# Patient Record
Sex: Female | Born: 1960 | Race: White | Hispanic: No | Marital: Single | Smoking: Current some day smoker
Health system: Southern US, Community
[De-identification: ages and names within clinical notes are randomized; demographics above are authoritative.]

## PROBLEM LIST (undated history)

## (undated) DIAGNOSIS — Z9109 Other allergy status, other than to drugs and biological substances: Secondary | ICD-10-CM

## (undated) DIAGNOSIS — F329 Major depressive disorder, single episode, unspecified: Secondary | ICD-10-CM

## (undated) DIAGNOSIS — K219 Gastro-esophageal reflux disease without esophagitis: Secondary | ICD-10-CM

## (undated) DIAGNOSIS — R519 Headache, unspecified: Secondary | ICD-10-CM

## (undated) DIAGNOSIS — T7840XA Allergy, unspecified, initial encounter: Secondary | ICD-10-CM

## (undated) DIAGNOSIS — M1990 Unspecified osteoarthritis, unspecified site: Secondary | ICD-10-CM

## (undated) DIAGNOSIS — R51 Headache: Secondary | ICD-10-CM

## (undated) DIAGNOSIS — F32A Depression, unspecified: Secondary | ICD-10-CM

## (undated) HISTORY — DX: Major depressive disorder, single episode, unspecified: F32.9

## (undated) HISTORY — DX: Gastro-esophageal reflux disease without esophagitis: K21.9

## (undated) HISTORY — DX: Allergy, unspecified, initial encounter: T78.40XA

## (undated) HISTORY — PX: GASTRIC BYPASS: SHX52

## (undated) HISTORY — PX: GALLBLADDER SURGERY: SHX652

## (undated) HISTORY — DX: Depression, unspecified: F32.A

---

## 2005-08-01 ENCOUNTER — Ambulatory Visit: Payer: Self-pay | Admitting: Family Medicine

## 2005-09-19 HISTORY — PX: COLONOSCOPY: SHX174

## 2007-11-26 ENCOUNTER — Ambulatory Visit: Payer: Self-pay | Admitting: Gastroenterology

## 2008-07-18 ENCOUNTER — Ambulatory Visit: Payer: Self-pay | Admitting: Family Medicine

## 2008-08-05 ENCOUNTER — Ambulatory Visit: Payer: Self-pay | Admitting: Family Medicine

## 2010-04-26 IMAGING — CR DG CHEST 2V
1 series · 2 of 2 positions shown · non-contrast
Comparison: none

REASON FOR EXAM: cough, pneumonia
COMMENTS:

[Series 1: view not recorded · 0.17mm/px · 2 of 2 slices shown]
[im 1/2]
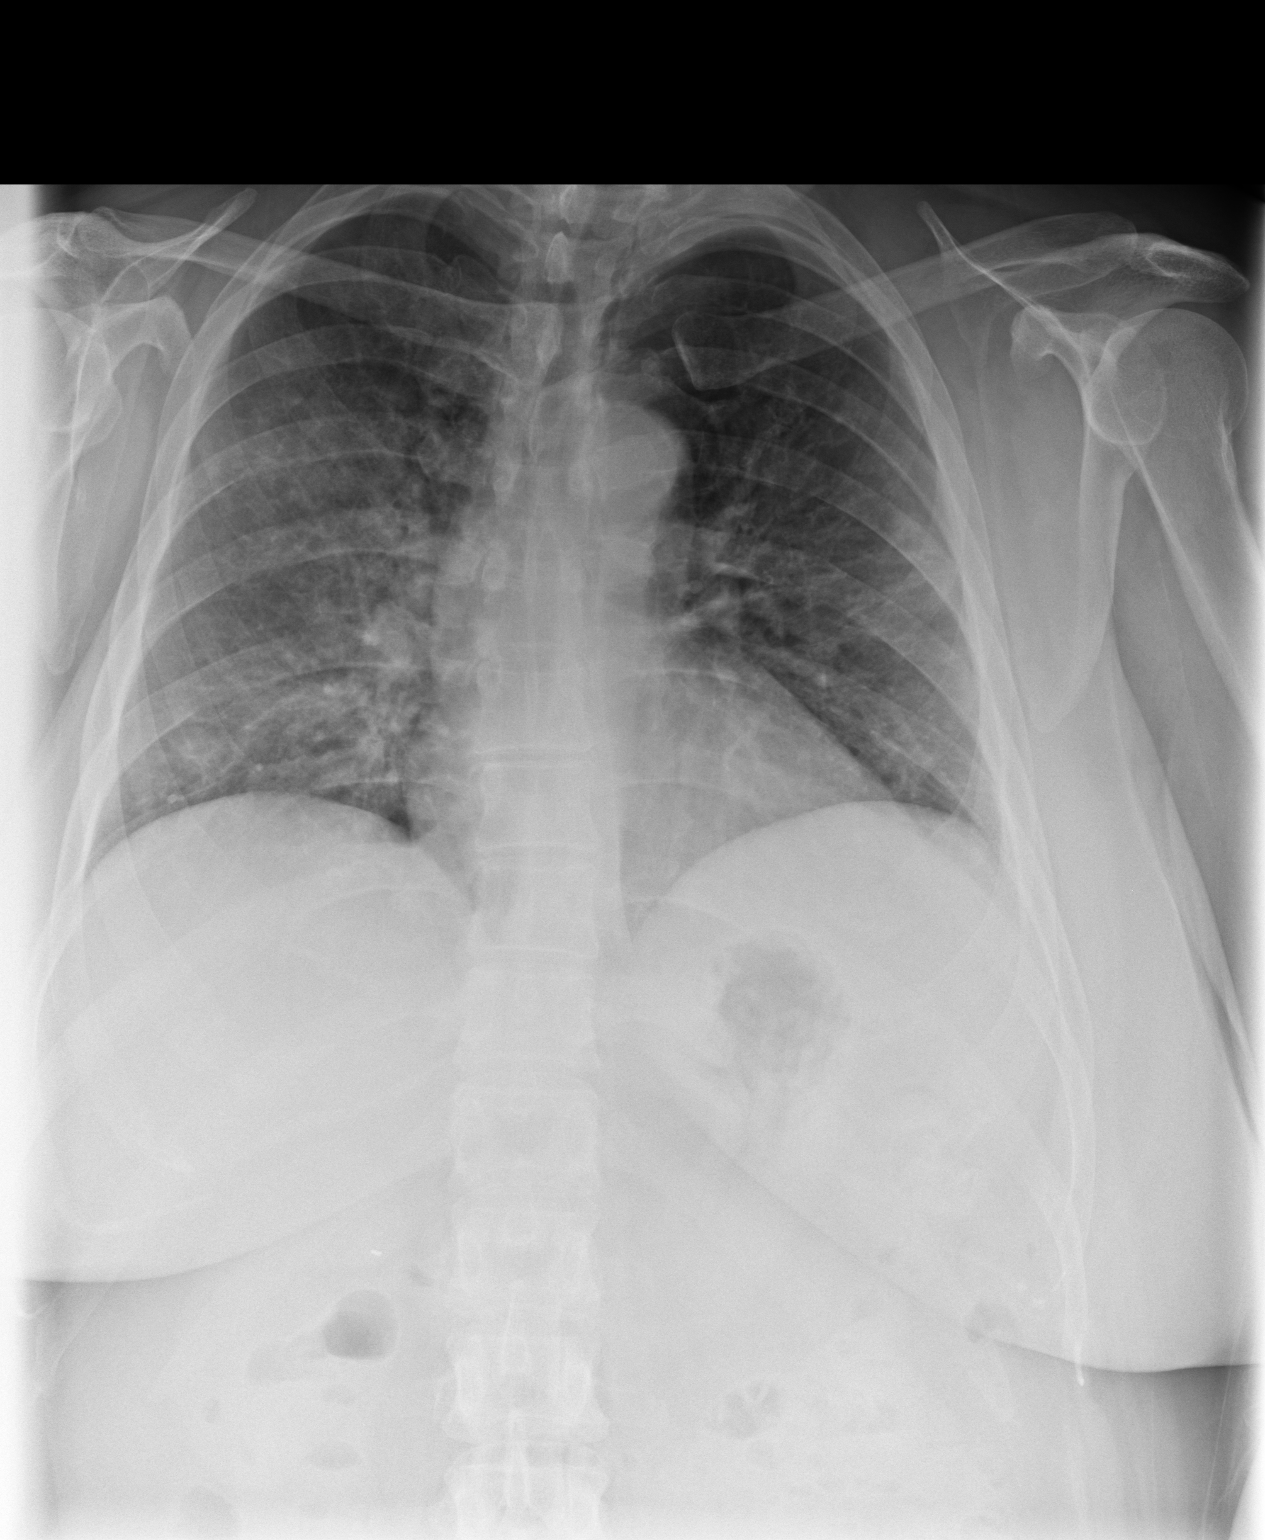
[im 2/2]
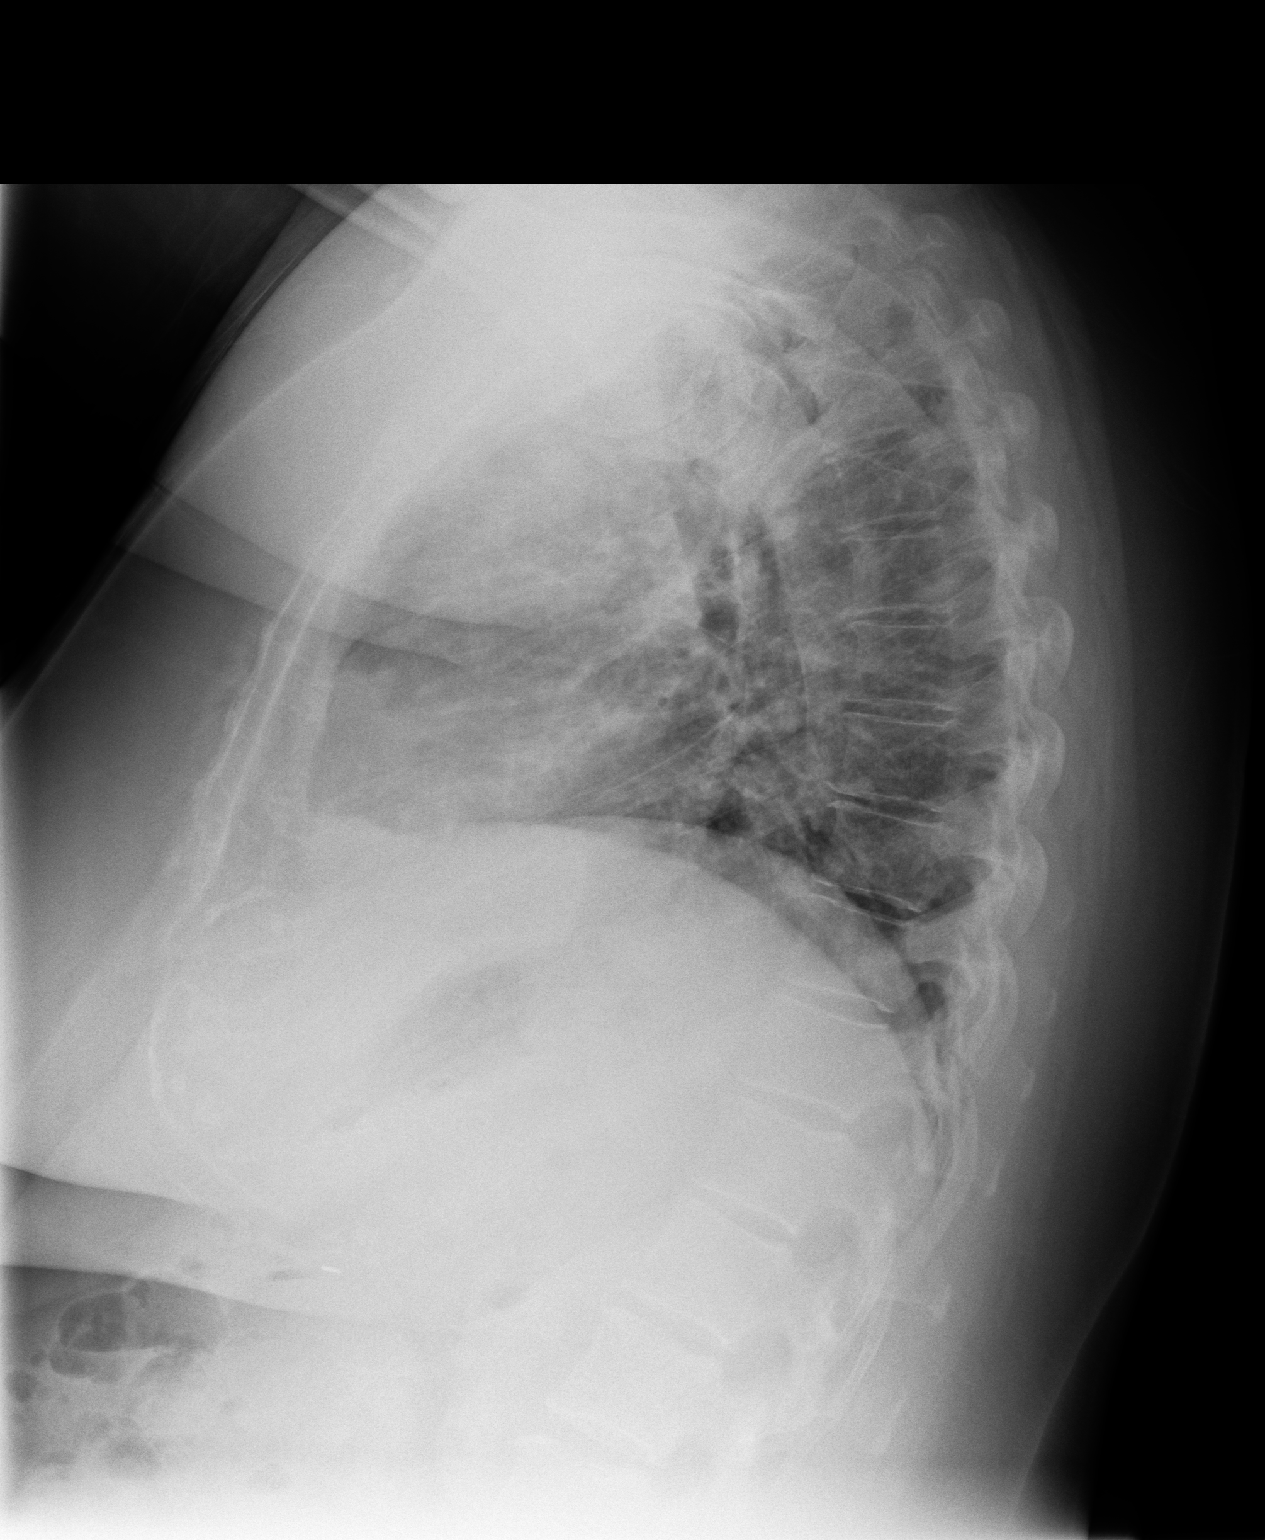

[2 of 2 positions shown; findings below may reference images not displayed]

PROCEDURE:     MDR - MDR CHEST PA(OR AP) AND LATERAL  - July 18, 2008 [DATE]

RESULT:     There is a hazy increase in density in the right midlung field
and a minimal focal area of increased density in the left midlung field.
These findings could be chronic or secondary to an early infiltrate.
Followup is recommended. No pleural effusion is seen. Heart size is normal.
IMPRESSION: 1. There is increased density observed bilaterally, more prominent on the
right and suspicious for pneumonia. Followup examination is recommended.

## 2012-09-19 HISTORY — PX: NASAL SINUS SURGERY: SHX719

## 2013-09-16 ENCOUNTER — Ambulatory Visit: Payer: Self-pay | Admitting: Otolaryngology

## 2014-02-20 ENCOUNTER — Ambulatory Visit: Payer: Self-pay | Admitting: Family Medicine

## 2015-01-06 ENCOUNTER — Ambulatory Visit
Admit: 2015-01-06 | Disposition: A | Discharge status: Home / Self Care | Payer: Self-pay | Attending: Family Medicine | Admitting: Family Medicine

## 2015-03-07 ENCOUNTER — Other Ambulatory Visit: Payer: Self-pay | Admitting: Family Medicine

## 2015-03-07 DIAGNOSIS — J301 Allergic rhinitis due to pollen: Secondary | ICD-10-CM

## 2015-07-06 ENCOUNTER — Other Ambulatory Visit: Payer: Self-pay | Admitting: Family Medicine

## 2015-07-27 ENCOUNTER — Other Ambulatory Visit: Payer: Self-pay | Admitting: Family Medicine

## 2015-07-27 DIAGNOSIS — K219 Gastro-esophageal reflux disease without esophagitis: Secondary | ICD-10-CM

## 2015-08-17 ENCOUNTER — Other Ambulatory Visit: Payer: Self-pay | Admitting: Family Medicine

## 2015-08-28 ENCOUNTER — Ambulatory Visit: Payer: Self-pay | Admitting: Family Medicine

## 2015-09-17 ENCOUNTER — Other Ambulatory Visit: Payer: Self-pay

## 2015-09-18 ENCOUNTER — Ambulatory Visit (INDEPENDENT_AMBULATORY_CARE_PROVIDER_SITE_OTHER): Payer: BC Managed Care – PPO | Admitting: Family Medicine

## 2015-09-18 ENCOUNTER — Encounter: Payer: Self-pay | Admitting: Family Medicine

## 2015-09-18 VITALS — BP 120/80 | HR 70 | Ht 63.0 in | Wt 183.0 lb

## 2015-09-18 DIAGNOSIS — F3342 Major depressive disorder, recurrent, in full remission: Secondary | ICD-10-CM | POA: Diagnosis not present

## 2015-09-18 DIAGNOSIS — K219 Gastro-esophageal reflux disease without esophagitis: Secondary | ICD-10-CM

## 2015-09-18 DIAGNOSIS — J301 Allergic rhinitis due to pollen: Secondary | ICD-10-CM | POA: Diagnosis not present

## 2015-09-18 MED ORDER — FLUTICASONE PROPIONATE 50 MCG/ACT NA SUSP: NASAL | Status: DC

## 2015-09-18 MED ORDER — ESOMEPRAZOLE MAGNESIUM 40 MG PO CPDR: 40.0000 mg | DELAYED_RELEASE_CAPSULE | Freq: Every day | ORAL | Status: DC

## 2015-09-18 MED ORDER — SERTRALINE HCL 100 MG PO TABS: ORAL_TABLET | ORAL | Status: DC

## 2015-09-18 NOTE — Progress Notes (Signed)
Name: Melissa Huerta   MRN: [id number]    DOB: 02/08/1961   Date:09/18/2015       Progress Note  Subjective  Chief Complaint  Chief Complaint  Patient presents with  . Gastroesophageal Reflux    double up to 40mg  on Nexium?  . Depression  . Allergic Rhinitis     Gastroesophageal Reflux She complains of heartburn and a sore throat. She reports no abdominal pain, no belching, no chest pain, no choking, no coughing, no dysphagia, no globus sensation, no hoarse voice, no nausea or no wheezing. This is a chronic problem. The current episode started more than 1 year ago. The problem occurs frequently. The problem has been gradually worsening. The heartburn duration is several minutes. The heartburn is located in the substernum. The heartburn is of moderate intensity. The heartburn wakes her from sleep. The heartburn does not limit her activity. The symptoms are aggravated by certain foods. Pertinent negatives include no melena or weight loss. There are no known risk factors. She has tried an antacid and a PPI for the symptoms. The treatment provided mild relief.  Depression      The patient presents with depression.  This is a chronic problem.  The current episode started more than 1 year ago.   The onset quality is gradual.   The problem occurs intermittently.  The problem has been waxing and waning since onset.  Associated symptoms include does not have insomnia, no myalgias, no headaches and no suicidal ideas.     The symptoms are aggravated by nothing.  Past treatments include SSRIs - Selective serotonin reuptake inhibitors.  Past medical history includes depression.     Pertinent negatives include no chronic fatigue syndrome, no chronic illness, no recent illness, no dementia, no post-traumatic stress disorder and no suicide attempts.   No problem-specific assessment & plan notes found for this encounter.   Past Medical History  Diagnosis Date  . Allergy   . Depression   . GERD  (gastroesophageal reflux disease)     Past Surgical History  Procedure Laterality Date  . Gallbladder surgery    . Gastric bypass    . Colonoscopy  2007     [facility] docs    Family History  Problem Relation Age of Onset  . Hypertension Mother   . Hypertension Sister     Social History   Social History  . Marital Status: Single    Spouse Name: N/A  . Number of Children: N/A  . Years of Education: N/A   Occupational History  . Not on file.   Social History Main Topics  . Smoking status: Current Some Day Smoker  . Smokeless tobacco: Not on file  . Alcohol Use: 0.0 oz/week    0 Standard drinks or equivalent per week  . Drug Use: No  . Sexual Activity: Not Currently   Other Topics Concern  . Not on file   Social History Narrative  . No narrative on file    Allergies  Allergen Reactions  . Codeine   . Penicillins      Review of Systems  Constitutional: Negative for fever, chills, weight loss and malaise/fatigue.  HENT: Positive for sore throat. Negative for ear discharge, ear pain and hoarse voice.   Eyes: Negative for blurred vision.  Respiratory: Negative for cough, sputum production, choking, shortness of breath and wheezing.   Cardiovascular: Negative for chest pain, palpitations and leg swelling.  Gastrointestinal: Positive for heartburn. Negative for dysphagia, nausea, abdominal pain, diarrhea,  constipation, blood in stool and melena.  Genitourinary: Negative for dysuria, urgency, frequency and hematuria.  Musculoskeletal: Negative for myalgias, back pain, joint pain and neck pain.  Skin: Negative for rash.  Neurological: Negative for dizziness, tingling, sensory change, focal weakness and headaches.  Endo/Heme/Allergies: Negative for environmental allergies and polydipsia. Does not bruise/bleed easily.  Psychiatric/Behavioral: Positive for depression. Negative for suicidal ideas. The patient is not nervous/anxious and does not have insomnia.       Objective  Filed Vitals:   09/18/15 1339  BP: 120/80  Pulse: 70  Height: 5\' 3"  (1.6 m)  Weight: 183 lb (83.008 kg)    Physical Exam  Constitutional: She is well-developed, well-nourished, and in no distress. No distress.  HENT:  Head: Normocephalic and atraumatic.  Right Ear: External ear normal.  Left Ear: External ear normal.  Nose: Nose normal.  Mouth/Throat: Oropharynx is clear and moist.  Eyes: Conjunctivae and EOM are normal. Pupils are equal, round, and reactive to light. Right eye exhibits no discharge. Left eye exhibits no discharge.  Neck: Normal range of motion. Neck supple. No JVD present. No thyromegaly present.  Cardiovascular: Normal rate, regular rhythm, normal heart sounds and intact distal pulses.  Exam reveals no gallop and no friction rub.   No murmur heard. Pulmonary/Chest: Effort normal and breath sounds normal. Right breast exhibits no inverted nipple, no mass, no nipple discharge, no skin change and no tenderness. Left breast exhibits tenderness. Left breast exhibits no inverted nipple, no mass, no nipple discharge and no skin change. Breasts are symmetrical.  Abdominal: Soft. Bowel sounds are normal. She exhibits no mass. There is no tenderness. There is no guarding.  Musculoskeletal: Normal range of motion. She exhibits no edema.  Lymphadenopathy:    She has no cervical adenopathy.  Neurological: She is alert. She has normal reflexes.  Skin: Skin is warm and dry. She is not diaphoretic.  Psychiatric: Mood and affect normal.  Nursing note and vitals reviewed.     Assessment & Plan  Problem List Items Addressed This Visit    None    Visit Diagnoses    Gastroesophageal reflux disease, esophagitis presence not specified    -  Primary    Relevant Medications    esomeprazole (NEXIUM) 40 MG capsule    Recurrent major depressive disorder, in full remission ([place])        Relevant Medications    sertraline (ZOLOFT) 100 MG tablet    Allergic  rhinitis due to pollen        Seasonal allergic rhinitis due to pollen        Relevant Medications    fluticasone (FLONASE) 50 MCG/ACT nasal spray         Dr. [facility]  09/18/2015

## 2015-10-20 ENCOUNTER — Other Ambulatory Visit: Payer: Self-pay

## 2015-10-20 MED ORDER — ESOMEPRAZOLE MAGNESIUM 20 MG PO CPDR: 20.0000 mg | DELAYED_RELEASE_CAPSULE | Freq: Every day | ORAL | Status: DC

## 2015-10-22 ENCOUNTER — Other Ambulatory Visit: Payer: Self-pay

## 2015-10-22 DIAGNOSIS — K219 Gastro-esophageal reflux disease without esophagitis: Secondary | ICD-10-CM

## 2015-10-22 MED ORDER — PANTOPRAZOLE SODIUM 40 MG PO TBEC
40.0000 mg | DELAYED_RELEASE_TABLET | Freq: Every day | ORAL | Status: DC
Start: 2015-10-22 — End: 2015-12-14

## 2015-11-11 ENCOUNTER — Ambulatory Visit (INDEPENDENT_AMBULATORY_CARE_PROVIDER_SITE_OTHER): Payer: BC Managed Care – PPO | Admitting: Family Medicine

## 2015-11-11 ENCOUNTER — Encounter: Payer: Self-pay | Admitting: Family Medicine

## 2015-11-11 VITALS — BP 130/62 | HR 74 | Ht 63.0 in | Wt 184.0 lb

## 2015-11-11 DIAGNOSIS — M1990 Unspecified osteoarthritis, unspecified site: Secondary | ICD-10-CM

## 2015-11-11 DIAGNOSIS — M19041 Primary osteoarthritis, right hand: Secondary | ICD-10-CM

## 2015-11-11 DIAGNOSIS — K219 Gastro-esophageal reflux disease without esophagitis: Secondary | ICD-10-CM

## 2015-11-11 DIAGNOSIS — M19042 Primary osteoarthritis, left hand: Principal | ICD-10-CM

## 2015-11-11 MED ORDER — ETODOLAC 400 MG PO TABS: 400.0000 mg | ORAL_TABLET | Freq: Two times a day (BID) | ORAL | Status: DC

## 2015-11-11 NOTE — Progress Notes (Signed)
Name: Melissa Huerta   MRN: [id number]    DOB: 07/14/1961   Date:11/11/2015       Progress Note  Subjective  Chief Complaint  Chief Complaint  Patient presents with  . Hand Pain    going up into her elbow in R) arm- involves mainly the pointer and middle fingers    Hand Pain  The incident occurred more than 1 week ago. There was no injury mechanism. The pain is present in the right wrist, right hand, right forearm and right fingers. The quality of the pain is described as aching. The pain is at a severity of 5/10. The pain is moderate. The pain has been fluctuating since the incident. Associated symptoms include numbness. Pertinent negatives include no chest pain, muscle weakness or tingling. She has tried acetaminophen and NSAIDs for the symptoms. The treatment provided mild relief.    No problem-specific assessment & plan notes found for this encounter.   Past Medical History  Diagnosis Date  . Allergy   . Depression   . GERD (gastroesophageal reflux disease)     Past Surgical History  Procedure Laterality Date  . Gallbladder surgery    . Gastric bypass    . Colonoscopy  2007     [facility] docs    Family History  Problem Relation Age of Onset  . Hypertension Mother   . Hypertension Sister     Social History   Social History  . Marital Status: Single    Spouse Name: N/A  . Number of Children: N/A  . Years of Education: N/A   Occupational History  . Not on file.   Social History Main Topics  . Smoking status: Current Some Day Smoker  . Smokeless tobacco: Not on file  . Alcohol Use: 0.0 oz/week    0 Standard drinks or equivalent per week  . Drug Use: No  . Sexual Activity: Not Currently   Other Topics Concern  . Not on file   Social History Narrative    Allergies  Allergen Reactions  . Codeine   . Penicillins      Review of Systems  Constitutional: Negative for fever, chills, weight loss and malaise/fatigue.  HENT: Negative for ear discharge, ear pain  and sore throat.   Eyes: Negative for blurred vision.  Respiratory: Negative for cough, sputum production, shortness of breath and wheezing.   Cardiovascular: Negative for chest pain, palpitations and leg swelling.  Gastrointestinal: Positive for heartburn. Negative for nausea, abdominal pain, diarrhea, constipation, blood in stool and melena.  Genitourinary: Negative for dysuria, urgency, frequency and hematuria.  Musculoskeletal: Negative for myalgias, back pain, joint pain and neck pain.  Skin: Negative for rash.  Neurological: Positive for numbness. Negative for dizziness, tingling, sensory change, focal weakness and headaches.  Endo/Heme/Allergies: Negative for environmental allergies and polydipsia. Does not bruise/bleed easily.  Psychiatric/Behavioral: Negative for depression and suicidal ideas. The patient is not nervous/anxious and does not have insomnia.      Objective  Filed Vitals:   11/11/15 1128  BP: 130/62  Pulse: 74  Height: 5\' 3"  (1.6 m)  Weight: 184 lb (83.462 kg)    Physical Exam  Constitutional: She is well-developed, well-nourished, and in no distress. No distress.  HENT:  Head: Normocephalic and atraumatic.  Right Ear: External ear normal.  Left Ear: External ear normal.  Nose: Nose normal.  Mouth/Throat: Oropharynx is clear and moist.  Eyes: Conjunctivae and EOM are normal. Pupils are equal, round, and reactive to light. Right eye  exhibits no discharge. Left eye exhibits no discharge.  Neck: Normal range of motion. Neck supple. No JVD present. No thyromegaly present.  Cardiovascular: Normal rate, regular rhythm, normal heart sounds and intact distal pulses.  Exam reveals no gallop and no friction rub.   No murmur heard. Pulmonary/Chest: Effort normal and breath sounds normal.  Abdominal: Soft. Bowel sounds are normal. She exhibits no mass. There is no tenderness. There is no guarding.  Musculoskeletal: Normal range of motion. She exhibits no edema.        Right hand: She exhibits tenderness and swelling. Normal sensation noted. Normal strength noted.       Left hand: She exhibits tenderness and swelling.  Lymphadenopathy:    She has no cervical adenopathy.  Neurological: She is alert.  Skin: Skin is warm and dry. She is not diaphoretic.  Psychiatric: Mood and affect normal.  Nursing note and vitals reviewed.     Assessment & Plan  Problem List Items Addressed This Visit    None    Visit Diagnoses    Arthritis of both hands    -  Primary    Relevant Medications    etodolac (LODINE) 400 MG tablet    Other Relevant Orders    Ambulatory referral to Rheumatology    Gastroesophageal reflux disease, esophagitis presence not specified        sample dexilant         Dr. [facility]  11/11/2015

## 2015-11-11 NOTE — Patient Instructions (Signed)
Rheumatoid Arthritis  Rheumatoid arthritis is a long-term (chronic) inflammatory disease that causes pain, swelling, and stiffness of the joints. It can affect the entire body, including the eyes and lungs. The effects of rheumatoid arthritis vary widely among those with the condition.  CAUSES  The cause of rheumatoid arthritis is not known. It tends to run in families and is more common in women. Certain cells of the body's natural defense system (immune system) do not work properly and begin to attack healthy joints. It primarily involves the connective tissue that lines the joints (synovial membrane). This can cause damage to the joint.  SYMPTOMS  · Pain, stiffness, swelling, and decreased motion of many joints, especially in the hands and feet.  · Stiffness that is worse in the morning. It may last 1-2 hours or longer.  · Numbness and tingling in the hands.  · Fatigue.  · Loss of appetite.  · Weight loss.  · Low-grade fever.  · Dry eyes and mouth.  · Firm lumps (rheumatoid nodules) that grow beneath the skin in areas such as the elbows and hands.  DIAGNOSIS  Diagnosis is based on the symptoms described, an exam, and blood tests. Sometimes, X-rays are helpful.  TREATMENT  The goals of treatment are to relieve pain, reduce inflammation, and to slow down or stop joint damage and disability. Methods vary and may include:  · Maintaining a balance of rest, exercise, and proper nutrition.  · Your health care provider may adjust your medicines every 3 months until treatment goals are reached. Common medicines include:    Pain relievers (analgesics).    Corticosteroids and nonsteroidal anti-inflammatory drugs (NSAIDs) to reduce inflammation.    Disease-modifying antirheumatic drugs (DMARDs) to try to slow the course of the disease.    Biologic response modifiers to reduce inflammation and damage.  · Physical therapy and occupational therapy.  · Surgery for patients with severe joint damage. Joint replacement or fusing of  joints may be needed.  · Routine monitoring and ongoing care, such as office visits, blood and urine tests, and X-rays.  Your health care provider will work with you to identify the best treatment option for you, based on an assessment of the overall disease activity in your body.  HOME CARE INSTRUCTIONS  · Remain physically active and reduce activity when the disease gets worse.  · Eat a well-balanced diet.  · Put heat on affected joints when you wake up and before activities. Keep the heat on the affected joint for as long as directed by your health care provider.  · Put ice on affected joints following activities or exercising.    Put ice in a plastic bag.    Place a towel between your skin and the bag.    Leave the ice on for 15-20 minutes, 3-4 times per day, or as directed by your health care provider.  · Take medicines and supplements only as directed by your health care provider.  · Use splints as directed by your health care provider. Splints help maintain joint position and function.  · Do not sleep with pillows under your knees. This may lead to spasms.  · Participate in a self-management program to keep current with the latest treatment and coping skills.  SEEK IMMEDIATE MEDICAL CARE IF:  · You have fainting episodes.  · You have periods of extreme weakness.  · You rapidly develop a hot, painful joint that is more severe than usual joint aches.  · You have chills.  ·   You have a fever.  FOR MORE INFORMATION  · American College of Rheumatology: www.rheumatology.org  · Arthritis Foundation: www.arthritis.org     This information is not intended to replace advice given to you by your health care provider. Make sure you discuss any questions you have with your health care provider.     Document Released: 09/02/2000 Document Revised: 09/26/2014 Document Reviewed: 10/12/2011  Elsevier Interactive Patient Education ©2016 [employer]

## 2015-12-09 ENCOUNTER — Telehealth: Payer: Self-pay

## 2015-12-09 ENCOUNTER — Other Ambulatory Visit: Payer: Self-pay | Admitting: Family Medicine

## 2015-12-09 DIAGNOSIS — F32A Depression, unspecified: Secondary | ICD-10-CM

## 2015-12-09 DIAGNOSIS — F329 Major depressive disorder, single episode, unspecified: Secondary | ICD-10-CM

## 2015-12-09 MED ORDER — SERTRALINE HCL 100 MG PO TABS: 100.0000 mg | ORAL_TABLET | Freq: Every day | ORAL | Status: DC

## 2015-12-09 MED ORDER — SERTRALINE HCL 50 MG PO TABS: 50.0000 mg | ORAL_TABLET | Freq: Every day | ORAL | Status: DC

## 2015-12-14 ENCOUNTER — Ambulatory Visit (INDEPENDENT_AMBULATORY_CARE_PROVIDER_SITE_OTHER): Payer: BC Managed Care – PPO | Admitting: Family Medicine

## 2015-12-14 ENCOUNTER — Encounter: Payer: Self-pay | Admitting: Family Medicine

## 2015-12-14 VITALS — BP 120/80 | HR 78 | Ht 63.0 in | Wt 184.0 lb

## 2015-12-14 DIAGNOSIS — R195 Other fecal abnormalities: Secondary | ICD-10-CM | POA: Diagnosis not present

## 2015-12-14 DIAGNOSIS — J301 Allergic rhinitis due to pollen: Secondary | ICD-10-CM | POA: Diagnosis not present

## 2015-12-14 DIAGNOSIS — Z1231 Encounter for screening mammogram for malignant neoplasm of breast: Secondary | ICD-10-CM | POA: Diagnosis not present

## 2015-12-14 DIAGNOSIS — Z0000 Encounter for general adult medical examination without abnormal findings: Secondary | ICD-10-CM

## 2015-12-14 DIAGNOSIS — K219 Gastro-esophageal reflux disease without esophagitis: Secondary | ICD-10-CM

## 2015-12-14 DIAGNOSIS — E663 Overweight: Secondary | ICD-10-CM | POA: Diagnosis not present

## 2015-12-14 DIAGNOSIS — J0100 Acute maxillary sinusitis, unspecified: Secondary | ICD-10-CM | POA: Diagnosis not present

## 2015-12-14 DIAGNOSIS — J40 Bronchitis, not specified as acute or chronic: Secondary | ICD-10-CM | POA: Diagnosis not present

## 2015-12-14 LAB — HEMOCCULT GUIAC POC 1CARD (OFFICE): Fecal Occult Blood, POC: POSITIVE — AB

## 2015-12-14 MED ORDER — PANTOPRAZOLE SODIUM 40 MG PO TBEC: 40.0000 mg | DELAYED_RELEASE_TABLET | Freq: Every day | ORAL | Status: DC

## 2015-12-14 MED ORDER — FLUTICASONE PROPIONATE 50 MCG/ACT NA SUSP: NASAL | Status: DC

## 2015-12-14 MED ORDER — AZITHROMYCIN 250 MG PO TABS: ORAL_TABLET | ORAL | Status: DC

## 2015-12-14 MED ORDER — SERTRALINE HCL 50 MG PO TABS: 50.0000 mg | ORAL_TABLET | Freq: Every day | ORAL | Status: DC

## 2015-12-14 NOTE — Progress Notes (Signed)
Name: Melissa Huerta   MRN: [id number]    DOB: 03-02-1961   Date:12/14/2015       Progress Note  Subjective  Chief Complaint  Chief Complaint  Patient presents with  . Annual Exam    no pap/ needs labs  . Sinusitis    cong and cough- brown/ green phlegm    HPI Comments: Patient presents for physical exam.   Sinusitis This is a new problem. The current episode started in the past 7 days. The problem has been waxing and waning since onset. There has been no fever. The pain is mild. Associated symptoms include congestion, coughing and a sore throat. Pertinent negatives include no chills, diaphoresis, ear pain, headaches, hoarse voice, neck pain, shortness of breath or swollen glands. Past treatments include acetaminophen and oral decongestants. The treatment provided moderate relief.  Cough This is a new problem. The problem has been gradually worsening. The cough is productive of brown sputum. Associated symptoms include ear congestion, nasal congestion, postnasal drip, a sore throat and wheezing. Pertinent negatives include no chest pain, chills, ear pain, fever, headaches, heartburn, myalgias, rash, shortness of breath or weight loss. Exacerbated by: smoking. The treatment provided no relief. There is no history of environmental allergies.  Gastroesophageal Reflux She complains of coughing, a sore throat and wheezing. She reports no abdominal pain, no chest pain, no heartburn, no hoarse voice or no nausea. The symptoms are aggravated by certain foods. Pertinent negatives include no melena or weight loss. She has tried a PPI for the symptoms. The treatment provided no relief.    No problem-specific assessment & plan notes found for this encounter.   Past Medical History  Diagnosis Date  . Allergy   . Depression   . GERD (gastroesophageal reflux disease)     Past Surgical History  Procedure Laterality Date  . Gallbladder surgery    . Gastric bypass    . Colonoscopy  2007     [facility]  docs    Family History  Problem Relation Age of Onset  . Hypertension Mother   . Hypertension Sister     Social History   Social History  . Marital Status: Single    Spouse Name: N/A  . Number of Children: N/A  . Years of Education: N/A   Occupational History  . Not on file.   Social History Main Topics  . Smoking status: Current Some Day Smoker  . Smokeless tobacco: Not on file  . Alcohol Use: 0.0 oz/week    0 Standard drinks or equivalent per week  . Drug Use: No  . Sexual Activity: Not Currently   Other Topics Concern  . Not on file   Social History Narrative    Allergies  Allergen Reactions  . Codeine   . Penicillins      Review of Systems  Constitutional: Negative for fever, chills, weight loss, malaise/fatigue and diaphoresis.  HENT: Positive for congestion, postnasal drip and sore throat. Negative for ear discharge, ear pain and hoarse voice.   Eyes: Negative for blurred vision.  Respiratory: Positive for cough and wheezing. Negative for sputum production and shortness of breath.   Cardiovascular: Negative for chest pain, palpitations and leg swelling.  Gastrointestinal: Negative for heartburn, nausea, abdominal pain, diarrhea, constipation, blood in stool and melena.  Genitourinary: Negative for dysuria, urgency, frequency and hematuria.  Musculoskeletal: Negative for myalgias, back pain, joint pain and neck pain.  Skin: Negative for rash.  Neurological: Negative for dizziness, tingling, sensory change, focal weakness  and headaches.  Endo/Heme/Allergies: Negative for environmental allergies and polydipsia. Does not bruise/bleed easily.  Psychiatric/Behavioral: Negative for depression and suicidal ideas. The patient is not nervous/anxious and does not have insomnia.      Objective  Filed Vitals:   12/14/15 0934  BP: 120/80  Pulse: 78  Height: 5\' 3"  (1.6 m)  Weight: 184 lb (83.462 kg)    Physical Exam  Constitutional: She is well-developed,  well-nourished, and in no distress. No distress.  HENT:  Head: Normocephalic and atraumatic.  Right Ear: Tympanic membrane, external ear and ear canal normal.  Left Ear: Tympanic membrane, external ear and ear canal normal.  Nose: Nose normal.  Mouth/Throat: Oropharynx is clear and moist.  Eyes: Conjunctivae and EOM are normal. Pupils are equal, round, and reactive to light. Right eye exhibits no discharge. Left eye exhibits no discharge.  Neck: Normal range of motion. Neck supple. No JVD present. No thyromegaly present.  Cardiovascular: Normal rate, regular rhythm, normal heart sounds and intact distal pulses.  Exam reveals no gallop and no friction rub.   No murmur heard. Pulmonary/Chest: Effort normal and breath sounds normal. Right breast exhibits no inverted nipple, no mass, no nipple discharge, no skin change and no tenderness. Left breast exhibits no inverted nipple, no mass, no nipple discharge, no skin change and no tenderness. Breasts are symmetrical.  Abdominal: Soft. Bowel sounds are normal. She exhibits no mass. There is no tenderness. There is no guarding.  Genitourinary: Vagina normal, uterus normal, right adnexa normal and left adnexa normal.  Musculoskeletal: Normal range of motion. She exhibits no edema.  Lymphadenopathy:    She has no cervical adenopathy.  Neurological: She is alert. She has normal reflexes.  Skin: Skin is warm and dry. She is not diaphoretic.  Psychiatric: Mood and affect normal.  Nursing note and vitals reviewed.     Assessment & Plan  Problem List Items Addressed This Visit    None    Visit Diagnoses    Annual physical exam    -  Primary    Relevant Orders    Lipid Profile    Renal Function Panel    POCT Occult Blood Stool (Completed)    Acute maxillary sinusitis, recurrence not specified        Relevant Medications    azithromycin (ZITHROMAX) 250 MG tablet    fluticasone (FLONASE) 50 MCG/ACT nasal spray    Bronchitis         Gastroesophageal reflux disease, esophagitis presence not specified        Relevant Medications    pantoprazole (PROTONIX) 40 MG tablet    Other Relevant Orders    H. pylori antibody, IgG    POCT Occult Blood Stool (Completed)    Overweight        Relevant Orders    Lipid Profile    Seasonal allergic rhinitis due to pollen        Relevant Medications    fluticasone (FLONASE) 50 MCG/ACT nasal spray    Gastroesophageal reflux disease without esophagitis        Relevant Medications    pantoprazole (PROTONIX) 40 MG tablet    Occult blood in stools        Relevant Orders    Ambulatory referral to Gastroenterology    Visit for screening mammogram        Relevant Orders    MM Digital Screening         Dr. [facility]  12/14/2015    

## 2015-12-15 LAB — H. PYLORI ANTIBODY, IGG: H Pylori IgG: <0.9 U/mL (ref 0.0–0.8)

## 2015-12-15 LAB — LIPID PANEL
Chol/HDL Ratio: 2.3 ratio units (ref 0.0–4.4)
Cholesterol, Total: 174 mg/dL (ref 100–199)
HDL: 77 mg/dL (ref >39)
LDL CALC: 81 mg/dL (ref 0–99)
Triglycerides: 79 mg/dL (ref 0–149)
VLDL CHOLESTEROL CAL: 16 mg/dL (ref 5–40)

## 2015-12-15 LAB — RENAL FUNCTION PANEL
Albumin: 4.1 g/dL (ref 3.5–5.5)
BUN/Creatinine Ratio: 13 (ref 9–23)
BUN: 11 mg/dL (ref 6–24)
CO2: 23 mmol/L (ref 18–29)
CREATININE: 0.84 mg/dL (ref 0.57–1.00)
Calcium: 9 mg/dL (ref 8.7–10.2)
Chloride: 101 mmol/L (ref 96–106)
GFR calc Af Amer: 90 mL/min/{1.73_m2} (ref >59)
GFR calc non Af Amer: 78 mL/min/{1.73_m2} (ref >59)
Glucose: 74 mg/dL (ref 65–99)
Phosphorus: 3.6 mg/dL (ref 2.5–4.5)
Potassium: 4.2 mmol/L (ref 3.5–5.2)
Sodium: 143 mmol/L (ref 134–144)

## 2015-12-18 ENCOUNTER — Other Ambulatory Visit: Payer: Self-pay

## 2016-01-05 ENCOUNTER — Other Ambulatory Visit: Payer: Self-pay

## 2016-01-05 MED ORDER — AZITHROMYCIN 250 MG PO TABS: ORAL_TABLET | ORAL | Status: DC

## 2016-01-14 ENCOUNTER — Other Ambulatory Visit: Payer: Self-pay

## 2016-01-14 ENCOUNTER — Telehealth: Payer: Self-pay

## 2016-01-14 NOTE — Telephone Encounter (Signed)
Gastroenterology Pre-Procedure Review  Request Date: 02/29/16 Requesting Physician: Dr. Ronnald Ramp  PATIENT REVIEW QUESTIONS: The patient responded to the following health history questions as indicated:    1. Are you having any GI issues? no 2. Do you have a personal history of Polyps? no 3. Do you have a family history of Colon Cancer or Polyps? no 4. Diabetes Mellitus? no 5. Joint replacements in the past 12 months?no 6. Major health problems in the past 3 months?no 7. Any artificial heart valves, MVP, or defibrillator?no    MEDICATIONS & ALLERGIES:    Patient reports the following regarding taking any anticoagulation/antiplatelet therapy:   Plavix, Coumadin, Eliquis, Xarelto, Lovenox, Pradaxa, Brilinta, or Effient? no Aspirin? no  Patient confirms/reports the following medications:  Current Outpatient Prescriptions  Medication Sig Dispense Refill  . azithromycin (ZITHROMAX) 250 MG tablet 2 today then 1 a day for 4 days 6 tablet 0  . fluticasone (FLONASE) 50 MCG/ACT nasal spray USE ONE SPRAY IN EACH NOSTRIL DAILY AS DIRECTED BY PHYSICIAN. 16 g 9  . pantoprazole (PROTONIX) 40 MG tablet Take 1 tablet (40 mg total) by mouth daily. 30 tablet 3  . sertraline (ZOLOFT) 50 MG tablet Take 1 tablet (50 mg total) by mouth daily. 30 tablet 3   No current facility-administered medications for this visit.    Patient confirms/reports the following allergies:  Allergies  Allergen Reactions  . Codeine   . Penicillins     No orders of the defined types were placed in this encounter.    AUTHORIZATION INFORMATION Primary Insurance: 1D#: Group #:  Secondary Insurance: 1D#: Group #:  SCHEDULE INFORMATION: Date: 02/29/16 Time: Location: [place]

## 2016-01-21 ENCOUNTER — Other Ambulatory Visit: Payer: Self-pay

## 2016-02-19 ENCOUNTER — Telehealth: Payer: Self-pay | Admitting: Gastroenterology

## 2016-02-19 ENCOUNTER — Encounter: Payer: Self-pay | Admitting: Anesthesiology

## 2016-02-19 NOTE — Telephone Encounter (Signed)
Noted. Called [facility] and cancelled.

## 2016-02-19 NOTE — Telephone Encounter (Signed)
Patient needs to cancel her colonoscopy on 6/12. She will call you back when she has a better date in mind.

## 2016-02-29 ENCOUNTER — Ambulatory Visit: Admission: RE | Admit: 2016-02-29 | Payer: Self-pay | Source: Ambulatory Visit | Admitting: Gastroenterology

## 2016-02-29 HISTORY — DX: Other allergy status, other than to drugs and biological substances: Z91.09

## 2016-02-29 HISTORY — DX: Headache: R51

## 2016-02-29 HISTORY — DX: Headache, unspecified: R51.9

## 2016-02-29 HISTORY — DX: Unspecified osteoarthritis, unspecified site: M19.90

## 2016-02-29 SURGERY — COLONOSCOPY WITH PROPOFOL
Anesthesia: Choice

## 2016-03-07 NOTE — Telephone Encounter (Signed)
completed

## 2016-03-17 ENCOUNTER — Other Ambulatory Visit: Payer: Self-pay

## 2016-03-17 MED ORDER — OMEPRAZOLE 40 MG PO CPDR: 40.0000 mg | DELAYED_RELEASE_CAPSULE | Freq: Every day | ORAL | Status: DC

## 2016-11-14 ENCOUNTER — Ambulatory Visit (INDEPENDENT_AMBULATORY_CARE_PROVIDER_SITE_OTHER): Payer: BC Managed Care – PPO | Admitting: Family Medicine

## 2016-11-14 ENCOUNTER — Encounter: Payer: Self-pay | Admitting: Family Medicine

## 2016-11-14 VITALS — BP 120/80 | HR 80 | Temp 98.0°F | Ht 63.0 in | Wt 184.0 lb

## 2016-11-14 DIAGNOSIS — Z23 Encounter for immunization: Secondary | ICD-10-CM | POA: Insufficient documentation

## 2016-11-14 DIAGNOSIS — J40 Bronchitis, not specified as acute or chronic: Secondary | ICD-10-CM

## 2016-11-14 DIAGNOSIS — J0100 Acute maxillary sinusitis, unspecified: Secondary | ICD-10-CM

## 2016-11-14 MED ORDER — BENZONATATE 100 MG PO CAPS: 100.0000 mg | ORAL_CAPSULE | Freq: Two times a day (BID) | ORAL | 0 refills | Status: DC | PRN

## 2016-11-14 MED ORDER — AZITHROMYCIN 250 MG PO TABS: ORAL_TABLET | ORAL | 1 refills | Status: DC

## 2016-11-14 NOTE — Progress Notes (Signed)
Name: Melissa Huerta   MRN: [id number]    DOB: [birth year]   Date:11/14/2016       Progress Note  Subjective  Chief Complaint  Chief Complaint  Patient presents with  . Cough    green production- taking Mucinex x 1 week/ felt better and then seemed to get worse yesterday. Cong and nasal drainage    Cough  This is a new problem. The current episode started in the past 7 days. The problem has been gradually worsening. The cough is productive of purulent sputum. Associated symptoms include chills, headaches, myalgias, nasal congestion, postnasal drip, rhinorrhea, a sore throat and wheezing. Pertinent negatives include no chest pain, ear congestion, ear pain, fever, heartburn, rash, shortness of breath or weight loss. The symptoms are aggravated by cold air. She has tried OTC cough suppressant for the symptoms. The treatment provided moderate relief. There is no history of asthma, bronchiectasis, bronchitis, COPD, emphysema, environmental allergies or pneumonia.  Sinusitis  This is a chronic problem. The current episode started more than 1 year ago. The problem has been gradually improving since onset. There has been no fever. Associated symptoms include chills, congestion, coughing, headaches, sinus pressure and a sore throat. Pertinent negatives include no ear pain, hoarse voice, neck pain or shortness of breath. Past treatments include acetaminophen (expectorant). The treatment provided no relief.    No problem-specific Assessment & Plan notes found for this encounter.   Past Medical History:  Diagnosis Date  . Allergy   . Arthritis    HANDS  . Depression   . GERD (gastroesophageal reflux disease)   . Headache    2-3/WEEK SINUS RELATED  . Pollen allergies    OUTDOOR    Past Surgical History:  Procedure Laterality Date  . COLONOSCOPY  2007    [facility] docs  . GALLBLADDER SURGERY    . GASTRIC BYPASS      Family History  Problem Relation Age of Onset  . Hypertension Mother   .  Hypertension Sister     Social History   Social History  . Marital status: Single    Spouse name: N/A  . Number of children: N/A  . Years of education: N/A   Occupational History  . Not on file.   Social History Main Topics  . Smoking status: Current Some Day Smoker    Packs/day: 0.25    Years: 30.00    Types: Cigarettes  . Smokeless tobacco: Never Used  . Alcohol use 3.6 oz/week    4 Cans of beer, 2 Shots of liquor per week  . Drug use: No  . Sexual activity: Not Currently   Other Topics Concern  . Not on file   Social History Narrative  . No narrative on file    Allergies  Allergen Reactions  . Codeine Other (See Comments)    STOMACH ISSUES  . Penicillins Other (See Comments)    LOCAL REACTION, SWELLING  . Percocet [Oxycodone-Acetaminophen] Nausea Only and Other (See Comments)    DARVOCET,DARVON, AND OTHER PAIN MEDICINES    Outpatient Medications Prior to Visit  Medication Sig Dispense Refill  . fluticasone (FLONASE) 50 MCG/ACT nasal spray USE ONE SPRAY IN EACH NOSTRIL DAILY AS DIRECTED BY PHYSICIAN. (Patient taking differently: Place 1 spray into both nostrils. AM/ USE ONE SPRAY IN EACH NOSTRIL DAILY AS DIRECTED BY PHYSICIAN.) 16 g 9  . Multiple Vitamins-Minerals (CENTRUM SILVER 50+WOMEN PO) Take by mouth daily. AM    . omeprazole (PRILOSEC) 40 MG capsule Take 1  capsule (40 mg total) by mouth daily. 30 capsule 5  . sertraline (ZOLOFT) 50 MG tablet Take 1 tablet (50 mg total) by mouth daily. (Patient taking differently: Take 50 mg by mouth daily. PM) 30 tablet 3  . azithromycin (ZITHROMAX) 250 MG tablet 2 today then 1 a day for 4 days (Patient not taking: Reported on 01/14/2016) 6 tablet 0   No facility-administered medications prior to visit.     Review of Systems  Constitutional: Positive for chills. Negative for fever, malaise/fatigue and weight loss.  HENT: Positive for congestion, postnasal drip, rhinorrhea, sinus pressure and sore throat. Negative for ear  discharge, ear pain and hoarse voice.   Eyes: Negative for blurred vision.  Respiratory: Positive for cough and wheezing. Negative for sputum production and shortness of breath.   Cardiovascular: Negative for chest pain, palpitations and leg swelling.  Gastrointestinal: Negative for abdominal pain, blood in stool, constipation, diarrhea, heartburn, melena and nausea.  Genitourinary: Negative for dysuria, frequency, hematuria and urgency.  Musculoskeletal: Positive for myalgias. Negative for back pain, joint pain and neck pain.  Skin: Negative for rash.  Neurological: Positive for headaches. Negative for dizziness, tingling, sensory change and focal weakness.  Endo/Heme/Allergies: Negative for environmental allergies and polydipsia. Does not bruise/bleed easily.  Psychiatric/Behavioral: Negative for depression and suicidal ideas. The patient is not nervous/anxious and does not have insomnia.      Objective  Vitals:   11/14/16 1506  BP: 120/80  Pulse: 80  Temp: 98 F (36.7 C)  TempSrc: Oral  Weight: 184 lb (83.5 kg)  Height: 5\' 3"  (1.6 m)    Physical Exam  Constitutional: She is well-developed, well-nourished, and in no distress. No distress.  HENT:  Head: Normocephalic and atraumatic.  Right Ear: External ear normal.  Left Ear: External ear normal.  Nose: Nose normal.  Mouth/Throat: Oropharynx is clear and moist.  Eyes: Conjunctivae and EOM are normal. Pupils are equal, round, and reactive to light. Right eye exhibits no discharge. Left eye exhibits no discharge.  Neck: Normal range of motion. Neck supple. No JVD present. No thyromegaly present.  Cardiovascular: Normal rate, regular rhythm, normal heart sounds and intact distal pulses.  Exam reveals no gallop and no friction rub.   No murmur heard. Pulmonary/Chest: Effort normal and breath sounds normal. She has no wheezes. She has no rales.  Abdominal: Soft. Bowel sounds are normal. She exhibits no mass. There is no  tenderness. There is no guarding.  Musculoskeletal: Normal range of motion. She exhibits no edema.  Lymphadenopathy:    She has no cervical adenopathy.  Neurological: She is alert. She has normal reflexes.  Skin: Skin is warm and dry. She is not diaphoretic.  Psychiatric: Mood and affect normal.  Nursing note and vitals reviewed.     Assessment & Plan  Problem List Items Addressed This Visit      Other   Influenza vaccine needed   Relevant Orders   Flu Vaccine QUAD 36+ mos IM (Completed)    Other Visit Diagnoses    Acute maxillary sinusitis, recurrence not specified    -  Primary   Relevant Medications   azithromycin (ZITHROMAX) 250 MG tablet   benzonatate (TESSALON) 100 MG capsule   Bronchitis       Relevant Medications   azithromycin (ZITHROMAX) 250 MG tablet   benzonatate (TESSALON) 100 MG capsule      Meds ordered this encounter  Medications  . azithromycin (ZITHROMAX) 250 MG tablet    Sig: 2 today then  1 a day for 4 days    Dispense:  6 tablet    Refill:  1  . benzonatate (TESSALON) 100 MG capsule    Sig: Take 1 capsule (100 mg total) by mouth 2 (two) times daily as needed for cough.    Dispense:  20 capsule    Refill:  0      Dr. Otilio Miu [facility]  11/14/16

## 2017-01-05 ENCOUNTER — Other Ambulatory Visit: Payer: Self-pay

## 2017-01-19 ENCOUNTER — Encounter: Payer: Self-pay | Admitting: Family Medicine

## 2017-01-19 ENCOUNTER — Ambulatory Visit (INDEPENDENT_AMBULATORY_CARE_PROVIDER_SITE_OTHER): Payer: BC Managed Care – PPO | Admitting: Family Medicine

## 2017-01-19 VITALS — BP 120/70 | HR 78 | Ht 63.0 in | Wt 181.0 lb

## 2017-01-19 DIAGNOSIS — Z1211 Encounter for screening for malignant neoplasm of colon: Secondary | ICD-10-CM | POA: Diagnosis not present

## 2017-01-19 DIAGNOSIS — Z1231 Encounter for screening mammogram for malignant neoplasm of breast: Secondary | ICD-10-CM | POA: Diagnosis not present

## 2017-01-19 DIAGNOSIS — N6489 Other specified disorders of breast: Secondary | ICD-10-CM

## 2017-01-19 DIAGNOSIS — Z0000 Encounter for general adult medical examination without abnormal findings: Secondary | ICD-10-CM

## 2017-01-19 DIAGNOSIS — Z1239 Encounter for other screening for malignant neoplasm of breast: Secondary | ICD-10-CM

## 2017-01-19 LAB — HEMOCCULT GUIAC POC 1CARD (OFFICE): Fecal Occult Blood, POC: NEGATIVE

## 2017-01-19 NOTE — Progress Notes (Signed)
Name: Melissa Huerta   MRN: [id number]    DOB: 09/27/1960   Date:01/19/2017       Progress Note  Subjective  Chief Complaint  Chief Complaint  Patient presents with  . Annual Exam    no pap, no colonoscopy, needs mammo  . Breast Pain    R) breast pain- "feel little knots" at 7:00    Patient presents annual physical exam.  Patient has noted right upper outer breast pain.  Onset couple weeks. No discharge/mass or distortion/asymmetry.    No problem-specific Assessment & Plan notes found for this encounter.   Past Medical History:  Diagnosis Date  . Allergy   . Arthritis    HANDS  . Depression   . GERD (gastroesophageal reflux disease)   . Headache    2-3/WEEK SINUS RELATED  . Pollen allergies    OUTDOOR    Past Surgical History:  Procedure Laterality Date  . COLONOSCOPY  2007    [facility] docs  . GALLBLADDER SURGERY    . GASTRIC BYPASS      Family History  Problem Relation Age of Onset  . Hypertension Mother   . Hypertension Sister     Social History   Social History  . Marital status: Single    Spouse name: N/A  . Number of children: N/A  . Years of education: N/A   Occupational History  . Not on file.   Social History Main Topics  . Smoking status: Current Some Day Smoker    Packs/day: 0.25    Years: 30.00    Types: Cigarettes  . Smokeless tobacco: Never Used  . Alcohol use 3.6 oz/week    4 Cans of beer, 2 Shots of liquor per week  . Drug use: No  . Sexual activity: Not Currently   Other Topics Concern  . Not on file   Social History Narrative  . No narrative on file    Allergies  Allergen Reactions  . Codeine Other (See Comments)    STOMACH ISSUES  . Penicillins Other (See Comments)    LOCAL REACTION, SWELLING  . Percocet [Oxycodone-Acetaminophen] Nausea Only and Other (See Comments)    DARVOCET,DARVON, AND OTHER PAIN MEDICINES    Outpatient Medications Prior to Visit  Medication Sig Dispense Refill  . fluticasone (FLONASE) 50 MCG/ACT  nasal spray USE ONE SPRAY IN EACH NOSTRIL DAILY AS DIRECTED BY PHYSICIAN. (Patient taking differently: Place 1 spray into both nostrils. AM/ USE ONE SPRAY IN EACH NOSTRIL DAILY AS DIRECTED BY PHYSICIAN.) 16 g 9  . Multiple Vitamins-Minerals (CENTRUM SILVER 50+WOMEN PO) Take by mouth daily. AM    . omeprazole (PRILOSEC) 40 MG capsule Take 1 capsule (40 mg total) by mouth daily. 30 capsule 5  . sertraline (ZOLOFT) 50 MG tablet Take 1 tablet (50 mg total) by mouth daily. (Patient taking differently: Take 50 mg by mouth daily. PM) 30 tablet 3  . azithromycin (ZITHROMAX) 250 MG tablet 2 today then 1 a day for 4 days 6 tablet 1  . benzonatate (TESSALON) 100 MG capsule Take 1 capsule (100 mg total) by mouth 2 (two) times daily as needed for cough. 20 capsule 0   No facility-administered medications prior to visit.     Review of Systems  Constitutional: Negative for chills, fever, malaise/fatigue and weight loss.  HENT: Negative for ear discharge, ear pain and sore throat.   Eyes: Negative for blurred vision.  Respiratory: Negative for cough, sputum production, shortness of breath and wheezing.   Cardiovascular:  Negative for chest pain, palpitations and leg swelling.  Gastrointestinal: Negative for abdominal pain, blood in stool, constipation, diarrhea, heartburn, melena and nausea.  Genitourinary: Negative for dysuria, frequency, hematuria and urgency.  Musculoskeletal: Negative for back pain, joint pain, myalgias and neck pain.  Skin: Negative for rash.  Neurological: Negative for dizziness, tingling, sensory change, focal weakness and headaches.  Endo/Heme/Allergies: Negative for environmental allergies and polydipsia. Does not bruise/bleed easily.  Psychiatric/Behavioral: Negative for depression and suicidal ideas. The patient is not nervous/anxious and does not have insomnia.      Objective  Vitals:   01/19/17 0912  BP: 120/70  Pulse: 78  Weight: 181 lb (82.1 kg)  Height: 5\' 3"  (1.6 m)      Physical Exam  Constitutional: She is oriented to person, place, and time and well-developed, well-nourished, and in no distress. No distress.  HENT:  Head: Normocephalic and atraumatic.  Right Ear: Tympanic membrane, external ear and ear canal normal.  Left Ear: Tympanic membrane, external ear and ear canal normal.  Nose: Nose normal.  Mouth/Throat: Uvula is midline, oropharynx is clear and moist and mucous membranes are normal.  Eyes: Conjunctivae and EOM are normal. Pupils are equal, round, and reactive to light. Right eye exhibits no discharge. Left eye exhibits no discharge.  Fundoscopic exam:      The right eye shows no arteriolar narrowing and no papilledema.       The left eye shows no arteriolar narrowing and no papilledema.  Neck: Trachea normal and normal range of motion. Neck supple. Normal carotid pulses, no hepatojugular reflux and no JVD present. Carotid bruit is not present. No thyromegaly present.  Cardiovascular: Normal rate, regular rhythm, S1 normal, S2 normal, normal heart sounds, intact distal pulses and normal pulses.  PMI is not displaced.  Exam reveals no gallop, no S3, no S4 and no friction rub.   No murmur heard. Pulmonary/Chest: Effort normal and breath sounds normal. No accessory muscle usage. No respiratory distress. She has no wheezes. She has no rales. She exhibits no mass. Right breast exhibits tenderness. Right breast exhibits no inverted nipple, no mass, no nipple discharge and no skin change.    Breast fullness right/10 o' clock  Abdominal: Soft. Bowel sounds are normal. She exhibits no mass. There is no hepatosplenomegaly. There is no tenderness. There is no guarding and no CVA tenderness.  Genitourinary: Rectum normal. Rectal exam shows guaiac negative stool.  Musculoskeletal: Normal range of motion. She exhibits no edema.       Cervical back: Normal.       Thoracic back: Normal.       Lumbar back: Normal.  Lymphadenopathy:       Head (right  side): No submandibular adenopathy present.       Head (left side): No submandibular adenopathy present.    She has no cervical adenopathy.    She has no axillary adenopathy.  Neurological: She is alert and oriented to person, place, and time. She has normal sensation, normal strength, normal reflexes and intact cranial nerves.  Skin: Skin is warm, dry and intact. She is not diaphoretic. No pallor.  Psychiatric: Mood, memory and affect normal.  No despondency/ lack of interest  Nursing note and vitals reviewed.     Assessment & Plan  Problem List Items Addressed This Visit    None    Visit Diagnoses    Annual physical exam    -  Primary   Relevant Orders   Lipid panel   Renal  Function Panel   Fullness of breast       right /10 o'clock   Relevant Orders   US BREAST LTD UNI RIGHT INC AXILLA   MM DIAG BREAST TOMO BILATERAL   Colon cancer screening       Relevant Orders   POCT occult blood stool (Completed)   Breast cancer screening       Relevant Orders   US BREAST LTD UNI RIGHT INC AXILLA   MM DIAG BREAST TOMO BILATERAL      No orders of the defined types were placed in this encounter.     Dr. [facility]  01/19/17

## 2017-01-20 LAB — RENAL FUNCTION PANEL
Albumin: 4.2 g/dL (ref 3.5–5.5)
BUN / CREAT RATIO: 15 (ref 9–23)
BUN: 12 mg/dL (ref 6–24)
CALCIUM: 9.7 mg/dL (ref 8.7–10.2)
CHLORIDE: 103 mmol/L (ref 96–106)
CO2: 26 mmol/L (ref 18–29)
Creatinine, Ser: 0.81 mg/dL (ref 0.57–1.00)
GFR calc non Af Amer: 81 mL/min/{1.73_m2} (ref >59)
GFR, EST AFRICAN AMERICAN: 93 mL/min/{1.73_m2} (ref >59)
GLUCOSE: 89 mg/dL (ref 65–99)
Phosphorus: 3.8 mg/dL (ref 2.5–4.5)
Potassium: 4.7 mmol/L (ref 3.5–5.2)
SODIUM: 146 mmol/L — AB (ref 134–144)

## 2017-01-20 LAB — LIPID PANEL
Chol/HDL Ratio: 3.4 ratio (ref 0.0–4.4)
Cholesterol, Total: 247 mg/dL — ABNORMAL HIGH (ref 100–199)
HDL: 72 mg/dL (ref >39)
LDL CALC: 159 mg/dL — AB (ref 0–99)
Triglycerides: 78 mg/dL (ref 0–149)
VLDL Cholesterol Cal: 16 mg/dL (ref 5–40)

## 2017-01-26 ENCOUNTER — Ambulatory Visit
Admission: RE | Admit: 2017-01-26 | Discharge: 2017-01-26 | Disposition: A | Discharge status: Home / Self Care | Payer: BC Managed Care – PPO | Source: Ambulatory Visit | Attending: Family Medicine | Admitting: Family Medicine

## 2017-01-26 DIAGNOSIS — N6489 Other specified disorders of breast: Secondary | ICD-10-CM | POA: Insufficient documentation

## 2017-01-26 DIAGNOSIS — Z1239 Encounter for other screening for malignant neoplasm of breast: Secondary | ICD-10-CM

## 2017-01-26 DIAGNOSIS — Z1231 Encounter for screening mammogram for malignant neoplasm of breast: Secondary | ICD-10-CM | POA: Insufficient documentation

## 2017-02-08 ENCOUNTER — Other Ambulatory Visit: Payer: Self-pay | Admitting: Family Medicine

## 2017-02-20 ENCOUNTER — Telehealth: Payer: Self-pay

## 2017-02-20 ENCOUNTER — Other Ambulatory Visit: Payer: Self-pay

## 2017-02-20 NOTE — Telephone Encounter (Signed)
Please call pt- she called in wanting a "pred pack sent in"- we do not send those in without being seen. Her last visit was for a breast exam/ fullness of breast?

## 2017-03-08 ENCOUNTER — Other Ambulatory Visit: Payer: Self-pay | Admitting: Family Medicine

## 2017-03-08 DIAGNOSIS — J301 Allergic rhinitis due to pollen: Secondary | ICD-10-CM

## 2017-04-10 ENCOUNTER — Other Ambulatory Visit: Payer: Self-pay | Admitting: Family Medicine

## 2017-08-18 ENCOUNTER — Other Ambulatory Visit: Payer: Self-pay | Admitting: Family Medicine

## 2017-09-21 ENCOUNTER — Other Ambulatory Visit: Payer: Self-pay | Admitting: Family Medicine

## 2017-10-23 ENCOUNTER — Other Ambulatory Visit: Payer: Self-pay

## 2017-11-08 ENCOUNTER — Encounter: Payer: Self-pay | Admitting: Family Medicine

## 2017-11-08 ENCOUNTER — Ambulatory Visit: Payer: BC Managed Care – PPO | Admitting: Family Medicine

## 2017-11-08 VITALS — BP 110/70 | HR 120 | Temp 100.0°F | Ht 63.0 in | Wt 181.0 lb

## 2017-11-08 DIAGNOSIS — J101 Influenza due to other identified influenza virus with other respiratory manifestations: Secondary | ICD-10-CM

## 2017-11-08 DIAGNOSIS — F3342 Major depressive disorder, recurrent, in full remission: Secondary | ICD-10-CM

## 2017-11-08 DIAGNOSIS — K219 Gastro-esophageal reflux disease without esophagitis: Secondary | ICD-10-CM

## 2017-11-08 DIAGNOSIS — J301 Allergic rhinitis due to pollen: Secondary | ICD-10-CM

## 2017-11-08 MED ORDER — OSELTAMIVIR PHOSPHATE 75 MG PO CAPS: 75.0000 mg | ORAL_CAPSULE | Freq: Two times a day (BID) | ORAL | 0 refills | Status: DC

## 2017-11-08 MED ORDER — SERTRALINE HCL 50 MG PO TABS: ORAL_TABLET | ORAL | 6 refills | Status: DC

## 2017-11-08 MED ORDER — FLUTICASONE PROPIONATE 50 MCG/ACT NA SUSP: NASAL | 11 refills | Status: DC

## 2017-11-08 MED ORDER — BENZONATATE 100 MG PO CAPS: 100.0000 mg | ORAL_CAPSULE | Freq: Two times a day (BID) | ORAL | 0 refills | Status: DC | PRN

## 2017-11-08 MED ORDER — OMEPRAZOLE 40 MG PO CPDR: DELAYED_RELEASE_CAPSULE | ORAL | 6 refills | Status: DC

## 2017-11-08 NOTE — Progress Notes (Signed)
Name: Melissa Huerta   MRN: [id number]    DOB: June 28, 1961   Date:11/08/2017       Progress Note  Subjective  Chief Complaint  Chief Complaint  Patient presents with  . Cough    started Friday- cough. Since yesterday, chills, sore throat, headache, sweating at night.   . Gastroesophageal Reflux  . Depression    Cough  The current episode started in the past 7 days. The problem has been gradually worsening. The cough is productive of sputum (prod whitish). Associated symptoms include chills, a fever, headaches, heartburn, myalgias, nasal congestion, a sore throat, sweats and weight loss. Pertinent negatives include no chest pain, ear congestion, ear pain, postnasal drip, rash, shortness of breath or wheezing. She has tried OTC cough suppressant for the symptoms. The treatment provided moderate relief. There is no history of asthma, bronchiectasis, bronchitis, COPD, emphysema, environmental allergies or pneumonia.  Gastroesophageal Reflux  She complains of coughing, heartburn and a sore throat. She reports no abdominal pain, no chest pain, no dysphagia, no nausea or no wheezing. This is a recurrent problem. The problem has been gradually improving. The heartburn is of mild intensity. The symptoms are aggravated by certain foods. Associated symptoms include weight loss. Pertinent negatives include no fatigue or melena. There are no known risk factors. She has tried a PPI for the symptoms. The treatment provided moderate relief.  Depression         This is a chronic problem.  The current episode started more than 1 year ago.   The onset quality is gradual.   The problem occurs intermittently.  The problem has been gradually improving since onset.  Associated symptoms include myalgias and headaches.  Associated symptoms include no decreased concentration, no fatigue, no helplessness, no hopelessness, does not have insomnia, not irritable, no decreased interest, not sad and no suicidal ideas.  Past  treatments include SSRIs - Selective serotonin reuptake inhibitors. Fever   This is a new problem. The current episode started in the past 7 days (Monday). The problem occurs constantly. The problem has been waxing and waning. The maximum temperature noted was 100 to 100.9 F. Associated symptoms include coughing, headaches and a sore throat. Pertinent negatives include no abdominal pain, chest pain, diarrhea, ear pain, nausea, rash, urinary pain or wheezing. She has tried acetaminophen (excedrin) for the symptoms. The treatment provided mild relief.    No problem-specific Assessment & Plan notes found for this encounter.   Past Medical History:  Diagnosis Date  . Allergy   . Arthritis    HANDS  . Depression   . GERD (gastroesophageal reflux disease)   . Headache    2-3/WEEK SINUS RELATED  . Pollen allergies    OUTDOOR    Past Surgical History:  Procedure Laterality Date  . COLONOSCOPY  2007    [facility] docs  . GALLBLADDER SURGERY    . GASTRIC BYPASS      Family History  Problem Relation Age of Onset  . Hypertension Mother   . Hypertension Sister   . Breast cancer Neg Hx     Social History   Socioeconomic History  . Marital status: Single    Spouse name: Not on file  . Number of children: Not on file  . Years of education: Not on file  . Highest education level: Not on file  Social Needs  . Financial resource strain: Not on file  . Food insecurity - worry: Not on file  . Food insecurity - inability: Not  on file  . Transportation needs - medical: Not on file  . Transportation needs - non-medical: Not on file  Occupational History  . Not on file  Tobacco Use  . Smoking status: Current Some Day Smoker    Packs/day: 0.25    Years: 30.00    Pack years: 7.50    Types: Cigarettes  . Smokeless tobacco: Never Used  Substance and Sexual Activity  . Alcohol use: Yes    Alcohol/week: 3.6 oz    Types: 4 Cans of beer, 2 Shots of liquor per week  . Drug use: No  . Sexual  activity: Not Currently  Other Topics Concern  . Not on file  Social History Narrative  . Not on file    Allergies  Allergen Reactions  . Codeine Other (See Comments)    STOMACH ISSUES  . Penicillins Other (See Comments)    LOCAL REACTION, SWELLING  . Percocet [Oxycodone-Acetaminophen] Nausea Only and Other (See Comments)    DARVOCET,DARVON, AND OTHER PAIN MEDICINES    Outpatient Medications Prior to Visit  Medication Sig Dispense Refill  . Melatonin 3 MG CAPS Take 1 capsule by mouth at bedtime.    . Multiple Vitamins-Minerals (CENTRUM SILVER 50+WOMEN PO) Take by mouth daily. AM    . fluticasone (FLONASE) 50 MCG/ACT nasal spray USE 1 SPRAY IN EACH NOSTRIL ONCE DAILY AS DIRECTED BY PHYSICIAN. 16 g 11  . omeprazole (PRILOSEC) 40 MG capsule TAKE (1) CAPSULE BY MOUTH EVERY DAY 30 capsule 0  . sertraline (ZOLOFT) 50 MG tablet TAKE ONE (1) TABLET BY MOUTH ONCE DAILY 30 tablet 0   No facility-administered medications prior to visit.     Review of Systems  Constitutional: Positive for chills, fever and weight loss. Negative for fatigue and malaise/fatigue.  HENT: Positive for sore throat. Negative for ear discharge, ear pain and postnasal drip.   Eyes: Negative for blurred vision.  Respiratory: Positive for cough. Negative for sputum production, shortness of breath and wheezing.   Cardiovascular: Negative for chest pain, palpitations and leg swelling.  Gastrointestinal: Positive for heartburn. Negative for abdominal pain, blood in stool, constipation, diarrhea, dysphagia, melena and nausea.  Genitourinary: Negative for dysuria, frequency, hematuria and urgency.  Musculoskeletal: Positive for myalgias. Negative for back pain, joint pain and neck pain.  Skin: Negative for rash.  Neurological: Positive for headaches. Negative for dizziness, tingling, sensory change and focal weakness.  Endo/Heme/Allergies: Negative for environmental allergies and polydipsia. Does not bruise/bleed easily.   Psychiatric/Behavioral: Positive for depression. Negative for decreased concentration and suicidal ideas. The patient is not nervous/anxious and does not have insomnia.      Objective  Vitals:   11/08/17 1113  BP: 110/70  Pulse: (!) 120  Temp: 100 F (37.8 C)  TempSrc: Oral  Weight: 181 lb (82.1 kg)  Height: 5\' 3"  (1.6 m)    Physical Exam  Constitutional: She is well-developed, well-nourished, and in no distress. She is not irritable. No distress.  HENT:  Head: Normocephalic and atraumatic.  Right Ear: External ear normal.  Left Ear: External ear normal.  Nose: Nose normal.  Mouth/Throat: Oropharynx is clear and moist.  Eyes: Conjunctivae and EOM are normal. Pupils are equal, round, and reactive to light. Right eye exhibits no discharge. Left eye exhibits no discharge.  Neck: Normal range of motion. Neck supple. No JVD present. No thyromegaly present.  Cardiovascular: Normal rate, regular rhythm, normal heart sounds and intact distal pulses. Exam reveals no gallop and no friction rub.  No murmur  heard. Pulmonary/Chest: Effort normal and breath sounds normal. She has no wheezes. She has no rales.  Abdominal: Soft. Bowel sounds are normal. She exhibits no mass. There is no tenderness. There is no guarding.  Musculoskeletal: Normal range of motion. She exhibits no edema.  Lymphadenopathy:    She has no cervical adenopathy.  Neurological: She is alert. She has normal reflexes.  Skin: Skin is warm and dry. She is not diaphoretic.  Psychiatric: Mood and affect normal.  Nursing note and vitals reviewed.     Assessment & Plan  Problem List Items Addressed This Visit      Digestive   Gastroesophageal reflux disease   Relevant Medications   omeprazole (PRILOSEC) 40 MG capsule     Other   Recurrent major depressive disorder, in full remission (HCC)   Relevant Medications   sertraline (ZOLOFT) 50 MG tablet    Other Visit Diagnoses    Influenza A    -  Primary   NEW    Relevant Medications   oseltamivir (TAMIFLU) 75 MG capsule   benzonatate (TESSALON) 100 MG capsule   Seasonal allergic rhinitis due to pollen       Relevant Medications   fluticasone (FLONASE) 50 MCG/ACT nasal spray      Meds ordered this encounter  Medications  . oseltamivir (TAMIFLU) 75 MG capsule    Sig: Take 1 capsule (75 mg total) by mouth 2 (two) times daily.    Dispense:  10 capsule    Refill:  0  . benzonatate (TESSALON) 100 MG capsule    Sig: Take 1 capsule (100 mg total) by mouth 2 (two) times daily as needed for cough.    Dispense:  20 capsule    Refill:  0  . omeprazole (PRILOSEC) 40 MG capsule    Sig: TAKE (1) CAPSULE BY MOUTH EVERY DAY    Dispense:  30 capsule    Refill:  6    Needs visit  . sertraline (ZOLOFT) 50 MG tablet    Sig: TAKE ONE (1) TABLET BY MOUTH ONCE DAILY    Dispense:  30 tablet    Refill:  6  . fluticasone (FLONASE) 50 MCG/ACT nasal spray    Sig: USE 1 SPRAY IN EACH NOSTRIL ONCE DAILY AS DIRECTED BY PHYSICIAN.    Dispense:  16 g    Refill:  11      Dr. [facility]  11/08/17

## 2018-05-25 ENCOUNTER — Ambulatory Visit (INDEPENDENT_AMBULATORY_CARE_PROVIDER_SITE_OTHER): Payer: BC Managed Care – PPO | Admitting: Family Medicine

## 2018-05-25 ENCOUNTER — Encounter: Payer: Self-pay | Admitting: Family Medicine

## 2018-05-25 ENCOUNTER — Other Ambulatory Visit (HOSPITAL_COMMUNITY)
Admission: RE | Admit: 2018-05-25 | Discharge: 2018-05-25 | Disposition: A | Discharge status: Home / Self Care | Payer: BC Managed Care – PPO | Source: Ambulatory Visit | Attending: Family Medicine | Admitting: Family Medicine

## 2018-05-25 VITALS — BP 130/80 | HR 64 | Ht 63.0 in | Wt 178.0 lb

## 2018-05-25 DIAGNOSIS — Z124 Encounter for screening for malignant neoplasm of cervix: Secondary | ICD-10-CM | POA: Diagnosis not present

## 2018-05-25 DIAGNOSIS — Z1231 Encounter for screening mammogram for malignant neoplasm of breast: Secondary | ICD-10-CM

## 2018-05-25 DIAGNOSIS — Z01419 Encounter for gynecological examination (general) (routine) without abnormal findings: Secondary | ICD-10-CM

## 2018-05-25 DIAGNOSIS — Z1211 Encounter for screening for malignant neoplasm of colon: Secondary | ICD-10-CM | POA: Diagnosis not present

## 2018-05-25 DIAGNOSIS — Z0000 Encounter for general adult medical examination without abnormal findings: Secondary | ICD-10-CM

## 2018-05-25 DIAGNOSIS — Z1151 Encounter for screening for human papillomavirus (HPV): Secondary | ICD-10-CM | POA: Diagnosis not present

## 2018-05-25 DIAGNOSIS — F5101 Primary insomnia: Secondary | ICD-10-CM

## 2018-05-25 DIAGNOSIS — N3941 Urge incontinence: Secondary | ICD-10-CM

## 2018-05-25 DIAGNOSIS — Z23 Encounter for immunization: Secondary | ICD-10-CM | POA: Diagnosis not present

## 2018-05-25 LAB — HEMOCCULT GUIAC POC 1CARD (OFFICE): Fecal Occult Blood, POC: NEGATIVE

## 2018-05-25 LAB — POCT URINALYSIS DIPSTICK
Bilirubin, UA: NEGATIVE
Blood, UA: NEGATIVE
Glucose, UA: NEGATIVE
Ketones, UA: NEGATIVE
Leukocytes, UA: NEGATIVE
NITRITE UA: NEGATIVE
PROTEIN UA: NEGATIVE
Spec Grav, UA: 1.01 (ref 1.010–1.025)
UROBILINOGEN UA: 0.2 U/dL
pH, UA: 5 (ref 5.0–8.0)

## 2018-05-25 MED ORDER — SERTRALINE HCL 50 MG PO TABS: ORAL_TABLET | ORAL | 6 refills | Status: DC

## 2018-05-25 MED ORDER — OXYBUTYNIN CHLORIDE ER 5 MG PO TB24: 5.0000 mg | ORAL_TABLET | Freq: Every day | ORAL | 2 refills | Status: DC

## 2018-05-25 MED ORDER — TRAZODONE HCL 50 MG PO TABS: 25.0000 mg | ORAL_TABLET | Freq: Every evening | ORAL | 3 refills | Status: DC | PRN

## 2018-05-25 NOTE — Progress Notes (Signed)
Name: Melissa Huerta   MRN: [id number]    DOB: [birth year]   Date:05/25/2018       Progress Note  Subjective  Chief Complaint  Chief Complaint  Patient presents with  . Annual Exam    pap needed  . Flu Vaccine  . Colon Cancer Screening    needs colonoscopy- Dr Allen Norris    Patient is a 57 year old female who presents for a comprehensive physical exam. The patient reports the following problems: urge/frequency. Health maintenance has been reviewed colonoscopy and pap smear.  Urinary Frequency   This is a new problem. The current episode started more than 1 month ago. The problem has been unchanged. The pain is mild. There has been no fever. Associated symptoms include urgency. Pertinent negatives include no chills, frequency, hematuria or nausea. Associated symptoms comments: nocturnal.  Insomnia  Primary symptoms: fragmented sleep, frequent awakening, premature morning awakening, no malaise/fatigue.  The current episode started more than one year. The problem occurs intermittently. The problem has been gradually worsening since onset. Past treatments include medication. Sleeping: 7:30.  PMH includes: none, no depression.    No problem-specific Assessment & Plan notes found for this encounter.   Past Medical History:  Diagnosis Date  . Allergy   . Arthritis    HANDS  . Depression   . GERD (gastroesophageal reflux disease)   . Headache    2-3/WEEK SINUS RELATED  . Pollen allergies    OUTDOOR    Past Surgical History:  Procedure Laterality Date  . COLONOSCOPY  2007    [facility] docs  . GALLBLADDER SURGERY    . GASTRIC BYPASS      Family History  Problem Relation Age of Onset  . Hypertension Mother   . Hypertension Sister   . Breast cancer Neg Hx     Social History   Socioeconomic History  . Marital status: Single    Spouse name: Not on file  . Number of children: Not on file  . Years of education: Not on file  . Highest education level: Not on file  Occupational History   . Not on file  Social Needs  . Financial resource strain: Not on file  . Food insecurity:    Worry: Not on file    Inability: Not on file  . Transportation needs:    Medical: Not on file    Non-medical: Not on file  Tobacco Use  . Smoking status: Current Some Day Smoker    Packs/day: 0.25    Years: 30.00    Pack years: 7.50    Types: Cigarettes  . Smokeless tobacco: Never Used  Substance and Sexual Activity  . Alcohol use: Yes    Alcohol/week: 6.0 standard drinks    Types: 4 Cans of beer, 2 Shots of liquor per week  . Drug use: No  . Sexual activity: Not Currently  Lifestyle  . Physical activity:    Days per week: Not on file    Minutes per session: Not on file  . Stress: Not on file  Relationships  . Social connections:    Talks on phone: Not on file    Gets together: Not on file    Attends religious service: Not on file    Active member of club or organization: Not on file    Attends meetings of clubs or organizations: Not on file    Relationship status: Not on file  . Intimate partner violence:    Fear of current or ex  partner: Not on file    Emotionally abused: Not on file    Physically abused: Not on file    Forced sexual activity: Not on file  Other Topics Concern  . Not on file  Social History Narrative  . Not on file    Allergies  Allergen Reactions  . Codeine Other (See Comments)    STOMACH ISSUES  . Penicillins Other (See Comments)    LOCAL REACTION, SWELLING  . Percocet [Oxycodone-Acetaminophen] Nausea Only and Other (See Comments)    DARVOCET,DARVON, AND OTHER PAIN MEDICINES    Outpatient Medications Prior to Visit  Medication Sig Dispense Refill  . fluticasone (FLONASE) 50 MCG/ACT nasal spray USE 1 SPRAY IN EACH NOSTRIL ONCE DAILY AS DIRECTED BY PHYSICIAN. 16 g 11  . Melatonin 3 MG CAPS Take 1 capsule by mouth at bedtime.    . Multiple Vitamins-Minerals (CENTRUM SILVER 50+WOMEN PO) Take by mouth daily. AM    . omeprazole (PRILOSEC) 40 MG  capsule TAKE (1) CAPSULE BY MOUTH EVERY DAY 30 capsule 6  . sertraline (ZOLOFT) 50 MG tablet TAKE ONE (1) TABLET BY MOUTH ONCE DAILY 30 tablet 6  . benzonatate (TESSALON) 100 MG capsule Take 1 capsule (100 mg total) by mouth 2 (two) times daily as needed for cough. 20 capsule 0  . oseltamivir (TAMIFLU) 75 MG capsule Take 1 capsule (75 mg total) by mouth 2 (two) times daily. 10 capsule 0   No facility-administered medications prior to visit.     Review of Systems  Constitutional: Negative for chills, fever, malaise/fatigue and weight loss.  HENT: Negative for ear discharge, ear pain and sore throat.   Eyes: Negative for blurred vision.  Respiratory: Negative for cough, sputum production, shortness of breath and wheezing.   Cardiovascular: Negative for chest pain, palpitations and leg swelling.  Gastrointestinal: Negative for abdominal pain, blood in stool, constipation, diarrhea, heartburn, melena and nausea.  Genitourinary: Positive for urgency. Negative for dysuria, frequency and hematuria.  Musculoskeletal: Negative for back pain, joint pain, myalgias and neck pain.  Skin: Negative for rash.  Neurological: Negative for dizziness, tingling, sensory change, focal weakness and headaches.  Endo/Heme/Allergies: Negative for environmental allergies and polydipsia. Does not bruise/bleed easily.  Psychiatric/Behavioral: Negative for depression and suicidal ideas. The patient has insomnia. The patient is not nervous/anxious.      Objective  Vitals:   05/25/18 0940  BP: 130/80  Pulse: 64  Weight: 178 lb (80.7 kg)  Height: 5\' 3"  (1.6 m)    Physical Exam  Constitutional: She is oriented to person, place, and time. Vital signs are normal. She appears well-developed and well-nourished. No distress.  HENT:  Head: Normocephalic and atraumatic.  Right Ear: Hearing, tympanic membrane, external ear and ear canal normal.  Left Ear: Hearing, tympanic membrane, external ear and ear canal normal.   Nose: Nose normal. No mucosal edema.  Mouth/Throat: Uvula is midline and oropharynx is clear and moist. No oropharyngeal exudate, posterior oropharyngeal edema or posterior oropharyngeal erythema.  Eyes: Pupils are equal, round, and reactive to light. Conjunctivae and EOM are normal. Right eye exhibits no discharge. Left eye exhibits no discharge.  Fundoscopic exam:      The right eye shows no AV nicking.       The left eye shows no AV nicking.  Neck: Normal range of motion. Neck supple. Normal carotid pulses, no hepatojugular reflux and no JVD present. Carotid bruit is not present. No thyroid mass and no thyromegaly present.  Cardiovascular: Normal rate, regular rhythm,  S1 normal, S2 normal, normal heart sounds and intact distal pulses. Exam reveals no gallop, no S4 and no friction rub.  No murmur heard. Pulses:      Carotid pulses are 2+ on the right side, and 2+ on the left side.      Radial pulses are 2+ on the right side, and 2+ on the left side.       Femoral pulses are 2+ on the right side, and 2+ on the left side.      Popliteal pulses are 2+ on the right side, and 2+ on the left side.       Dorsalis pedis pulses are 2+ on the right side, and 2+ on the left side.       Posterior tibial pulses are 2+ on the right side, and 2+ on the left side.  Pulmonary/Chest: Effort normal and breath sounds normal. No stridor. She has no decreased breath sounds. She has no wheezes. Right breast exhibits no inverted nipple, no mass, no nipple discharge, no skin change and no tenderness. Left breast exhibits no inverted nipple, no mass, no nipple discharge, no skin change and no tenderness. No breast swelling, tenderness or discharge.  Abdominal: Soft. Normal appearance and bowel sounds are normal. She exhibits no mass. There is no tenderness. There is no guarding.  Genitourinary: Rectum normal and vagina normal. Rectal exam shows no mass and guaiac negative stool. No breast swelling, tenderness or  discharge. Pelvic exam was performed with patient supine. There is no lesion on the right labia. There is no lesion on the left labia. Right adnexum displays no mass, no tenderness and no fullness. Left adnexum displays no mass, no tenderness and no fullness.  Musculoskeletal: Normal range of motion. She exhibits no edema.  Lymphadenopathy:       Head (right side): No submandibular adenopathy present.       Head (left side): No submandibular adenopathy present.    She has no cervical adenopathy.  Neurological: She is alert and oriented to person, place, and time. She has normal strength and normal reflexes. No cranial nerve deficit or sensory deficit.  Skin: Skin is warm and dry. She is not diaphoretic.  Nursing note and vitals reviewed.     Assessment & Plan  Problem List Items Addressed This Visit    None    Visit Diagnoses    Annual physical exam    -  Primary   No subjective/objective concerns.   Relevant Orders   Renal Function Panel   Lipid panel   Periodic health assessment, Pap and pelvic       Pap and pelvic obtained   Primary insomnia       Recent onset Uncontrolled prescribed trazodone- take 1/2 to 1 tablet 50 mg  before bed   Urge incontinence       recent onset uncontrolled trial of oxybutynin 5 mg q hs.   Relevant Medications   oxybutynin (DITROPAN-XL) 5 MG 24 hr tablet   Other Relevant Orders   POCT Urinalysis Dipstick (Completed)   Cervical cancer screening       obtained pap   Relevant Orders   Cytology - PAP   Colon cancer screening       refer back to Dr Allen Norris for repeat colonoscopy   Relevant Orders   Ambulatory referral to Gastroenterology   POCT occult blood stool (Completed)   Breast cancer screening by mammogram       scheduled mammo   Relevant Orders  MM 3D SCREEN BREAST BILATERAL   Flu vaccine need       discussed and administered   Relevant Orders   Flu Vaccine QUAD 6+ mos PF IM (Fluarix Quad PF) (Completed)      Meds ordered this  encounter  Medications  . traZODone (DESYREL) 50 MG tablet    Sig: Take 0.5-1 tablets (25-50 mg total) by mouth at bedtime as needed for sleep.    Dispense:  30 tablet    Refill:  3  . oxybutynin (DITROPAN-XL) 5 MG 24 hr tablet    Sig: Take 1 tablet (5 mg total) by mouth at bedtime.    Dispense:  30 tablet    Refill:  2  . sertraline (ZOLOFT) 50 MG tablet    Sig: TAKE ONE (1) TABLET BY MOUTH ONCE DAILY    Dispense:  30 tablet    Refill:  6      Dr. Deanna Jones [facility]  05/25/18

## 2018-05-26 LAB — RENAL FUNCTION PANEL
Albumin: 4.1 g/dL (ref 3.5–5.5)
BUN / CREAT RATIO: 15 (ref 9–23)
BUN: 12 mg/dL (ref 6–24)
CO2: 23 mmol/L (ref 20–29)
CREATININE: 0.78 mg/dL (ref 0.57–1.00)
Calcium: 9.4 mg/dL (ref 8.7–10.2)
Chloride: 103 mmol/L (ref 96–106)
GFR calc Af Amer: 97 mL/min/{1.73_m2} (ref >59)
GFR, EST NON AFRICAN AMERICAN: 84 mL/min/{1.73_m2} (ref >59)
Glucose: 83 mg/dL (ref 65–99)
Phosphorus: 3.7 mg/dL (ref 2.5–4.5)
Potassium: 4.2 mmol/L (ref 3.5–5.2)
SODIUM: 142 mmol/L (ref 134–144)

## 2018-05-26 LAB — LIPID PANEL
CHOL/HDL RATIO: 3.8 ratio (ref 0.0–4.4)
Cholesterol, Total: 240 mg/dL — ABNORMAL HIGH (ref 100–199)
HDL: 63 mg/dL (ref >39)
LDL Calculated: 157 mg/dL — ABNORMAL HIGH (ref 0–99)
Triglycerides: 98 mg/dL (ref 0–149)
VLDL Cholesterol Cal: 20 mg/dL (ref 5–40)

## 2018-05-29 LAB — CYTOLOGY - PAP
DIAGNOSIS: NEGATIVE
HPV (WINDOPATH): NOT DETECTED

## 2018-06-05 ENCOUNTER — Inpatient Hospital Stay: Admission: RE | Admit: 2018-06-05 | Payer: BC Managed Care – PPO | Source: Ambulatory Visit

## 2018-06-11 ENCOUNTER — Encounter (INDEPENDENT_AMBULATORY_CARE_PROVIDER_SITE_OTHER): Payer: Self-pay

## 2018-06-11 ENCOUNTER — Ambulatory Visit
Admission: RE | Admit: 2018-06-11 | Discharge: 2018-06-11 | Disposition: A | Discharge status: Home / Self Care | Payer: BC Managed Care – PPO | Source: Ambulatory Visit | Attending: Family Medicine | Admitting: Family Medicine

## 2018-06-11 DIAGNOSIS — Z1231 Encounter for screening mammogram for malignant neoplasm of breast: Secondary | ICD-10-CM

## 2018-06-12 ENCOUNTER — Other Ambulatory Visit: Payer: Self-pay

## 2018-06-12 DIAGNOSIS — Z1211 Encounter for screening for malignant neoplasm of colon: Secondary | ICD-10-CM

## 2018-06-15 ENCOUNTER — Other Ambulatory Visit: Payer: Self-pay | Admitting: Family Medicine

## 2018-06-27 ENCOUNTER — Other Ambulatory Visit: Payer: Self-pay

## 2018-06-27 ENCOUNTER — Encounter: Payer: Self-pay | Admitting: *Deleted

## 2018-07-04 NOTE — Discharge Instructions (Signed)
General Anesthesia, Adult, Care After °These instructions provide you with information about caring for yourself after your procedure. Your health care provider may also give you more specific instructions. Your treatment has been planned according to current medical practices, but problems sometimes occur. Call your health care provider if you have any problems or questions after your procedure. °What can I expect after the procedure? °After the procedure, it is common to have: °· Vomiting. °· A sore throat. °· Mental slowness. ° °It is common to feel: °· Nauseous. °· Cold or shivery. °· Sleepy. °· Tired. °· Sore or achy, even in parts of your body where you did not have surgery. ° °Follow these instructions at home: °For at least 24 hours after the procedure: °· Do not: °? Participate in activities where you could fall or become injured. °? Drive. °? Use heavy machinery. °? Drink alcohol. °? Take sleeping pills or medicines that cause drowsiness. °? Make important decisions or sign legal documents. °? Take care of children on your own. °· Rest. °Eating and drinking °· If you vomit, drink water, juice, or soup when you can drink without vomiting. °· Drink enough fluid to keep your urine clear or pale yellow. °· Make sure you have little or no nausea before eating solid foods. °· Follow the diet recommended by your health care provider. °General instructions °· Have a responsible adult stay with you until you are awake and alert. °· Return to your normal activities as told by your health care provider. Ask your health care provider what activities are safe for you. °· Take over-the-counter and prescription medicines only as told by your health care provider. °· If you smoke, do not smoke without supervision. °· Keep all follow-up visits as told by your health care provider. This is important. °Contact a health care provider if: °· You continue to have nausea or vomiting at home, and medicines are not helpful. °· You  cannot drink fluids or start eating again. °· You cannot urinate after 8-12 hours. °· You develop a skin rash. °· You have fever. °· You have increasing redness at the site of your procedure. °Get help right away if: °· You have difficulty breathing. °· You have chest pain. °· You have unexpected bleeding. °· You feel that you are having a life-threatening or urgent problem. °This information is not intended to replace advice given to you by your health care provider. Make sure you discuss any questions you have with your health care provider. °Document Released: 12/12/2000 Document Revised: 02/08/2016 Document Reviewed: 08/20/2015 °Elsevier Interactive Patient Education © 2018 [employer] ° °

## 2018-07-05 ENCOUNTER — Ambulatory Visit: Payer: BC Managed Care – PPO | Admitting: Anesthesiology

## 2018-07-05 ENCOUNTER — Ambulatory Visit
Admission: RE | Admit: 2018-07-05 | Discharge: 2018-07-05 | Disposition: A | Discharge status: Home / Self Care | Payer: BC Managed Care – PPO | Source: Ambulatory Visit | Attending: Gastroenterology | Admitting: Gastroenterology

## 2018-07-05 ENCOUNTER — Encounter
Admission: RE | Disposition: A | Discharge status: Home / Self Care | Payer: Self-pay | Source: Ambulatory Visit | Attending: Gastroenterology

## 2018-07-05 DIAGNOSIS — Z1211 Encounter for screening for malignant neoplasm of colon: Secondary | ICD-10-CM

## 2018-07-05 DIAGNOSIS — Z9884 Bariatric surgery status: Secondary | ICD-10-CM | POA: Diagnosis not present

## 2018-07-05 DIAGNOSIS — F329 Major depressive disorder, single episode, unspecified: Secondary | ICD-10-CM | POA: Diagnosis not present

## 2018-07-05 DIAGNOSIS — Z880 Allergy status to penicillin: Secondary | ICD-10-CM | POA: Insufficient documentation

## 2018-07-05 DIAGNOSIS — K640 First degree hemorrhoids: Secondary | ICD-10-CM | POA: Insufficient documentation

## 2018-07-05 DIAGNOSIS — D122 Benign neoplasm of ascending colon: Secondary | ICD-10-CM

## 2018-07-05 DIAGNOSIS — K219 Gastro-esophageal reflux disease without esophagitis: Secondary | ICD-10-CM | POA: Insufficient documentation

## 2018-07-05 DIAGNOSIS — Z79899 Other long term (current) drug therapy: Secondary | ICD-10-CM | POA: Insufficient documentation

## 2018-07-05 DIAGNOSIS — Z885 Allergy status to narcotic agent status: Secondary | ICD-10-CM | POA: Insufficient documentation

## 2018-07-05 DIAGNOSIS — M19041 Primary osteoarthritis, right hand: Secondary | ICD-10-CM | POA: Insufficient documentation

## 2018-07-05 DIAGNOSIS — M19042 Primary osteoarthritis, left hand: Secondary | ICD-10-CM | POA: Diagnosis not present

## 2018-07-05 DIAGNOSIS — D124 Benign neoplasm of descending colon: Secondary | ICD-10-CM | POA: Diagnosis not present

## 2018-07-05 DIAGNOSIS — F17210 Nicotine dependence, cigarettes, uncomplicated: Secondary | ICD-10-CM | POA: Diagnosis not present

## 2018-07-05 HISTORY — PX: POLYPECTOMY: SHX5525

## 2018-07-05 HISTORY — PX: COLONOSCOPY WITH PROPOFOL: SHX5780

## 2018-07-05 SURGERY — COLONOSCOPY WITH PROPOFOL
Anesthesia: General | Site: Rectum

## 2018-07-05 MED ORDER — LIDOCAINE HCL (CARDIAC) PF 100 MG/5ML IV SOSY
PREFILLED_SYRINGE | INTRAVENOUS | Status: DC | PRN
  Administered 2018-07-05: 40 mg via INTRAVENOUS

## 2018-07-05 MED ORDER — LACTATED RINGERS IV SOLN
INTRAVENOUS | Status: DC
  Administered 2018-07-05: 07:00:00 via INTRAVENOUS

## 2018-07-05 MED ORDER — PROPOFOL 10 MG/ML IV BOLUS
INTRAVENOUS | Status: DC | PRN
  Administered 2018-07-05 (×3): 20 mg via INTRAVENOUS
  Administered 2018-07-05: 70 mg via INTRAVENOUS
  Administered 2018-07-05 (×6): 20 mg via INTRAVENOUS

## 2018-07-05 MED ORDER — SODIUM CHLORIDE 0.9 % IV SOLN: INTRAVENOUS | Status: DC

## 2018-07-05 MED ORDER — STERILE WATER FOR IRRIGATION IR SOLN
Status: DC | PRN
  Administered 2018-07-05: 08:00:00

## 2018-07-05 SURGICAL SUPPLY — 20 items
CANISTER SUCT 1200ML W/VALVE (MISCELLANEOUS) ×3 IMPLANT
CLIP HMST 235XBRD CATH ROT (MISCELLANEOUS) ×1 IMPLANT
CLIP RESOLUTION 360 11X235 (MISCELLANEOUS) ×2
ELECT REM PT RETURN 9FT ADLT (ELECTROSURGICAL) ×3
ELECTRODE REM PT RTRN 9FT ADLT (ELECTROSURGICAL) ×1 IMPLANT
ELEVIEW  INJECTABLE COMP 10 (MISCELLANEOUS) ×2
FORCEPS BIOP RAD 4 LRG CAP 4 (CUTTING FORCEPS) IMPLANT
GOWN CVR UNV OPN BCK APRN NK (MISCELLANEOUS) ×2 IMPLANT
GOWN ISOL THUMB LOOP REG UNIV (MISCELLANEOUS) ×4
INJECTABLE ELEVIEW COMP 10 (MISCELLANEOUS) ×1 IMPLANT
INJECTOR VARIJECT VIN23 (MISCELLANEOUS) ×1 IMPLANT
KIT ENDO PROCEDURE OLY (KITS) ×3 IMPLANT
MARKER SPOT ENDO TATTOO 5ML (MISCELLANEOUS) IMPLANT
SNARE SHORT THROW 13M SML OVAL (MISCELLANEOUS) ×3 IMPLANT
SNARE SHORT THROW 30M LRG OVAL (MISCELLANEOUS) IMPLANT
SNARE SNG USE RND 15MM (INSTRUMENTS) IMPLANT
SPOT EX ENDOSCOPIC TATTOO (MISCELLANEOUS)
TRAP ETRAP POLY (MISCELLANEOUS) ×3 IMPLANT
VARIJECT INJECTOR VIN23 (MISCELLANEOUS) ×3
WATER STERILE IRR 250ML POUR (IV SOLUTION) ×3 IMPLANT

## 2018-07-05 NOTE — Anesthesia Postprocedure Evaluation (Signed)
Anesthesia Post Note  Patient: Melissa Huerta  Procedure(s) Performed: COLONOSCOPY WITH BIOPSIES (N/A Rectum) POLYPECTOMY (N/A Rectum)  Patient location during evaluation: PACU Anesthesia Type: General Level of consciousness: awake and alert Pain management: pain level controlled Vital Signs Assessment: post-procedure vital signs reviewed and stable Respiratory status: spontaneous breathing Cardiovascular status: blood pressure returned to baseline Anesthetic complications: no    Jaci Standard, III,  Melecio Cueto D

## 2018-07-05 NOTE — H&P (Signed)
Lucilla Lame, MD [address], [place] [address] Phone: [phone] Fax : [phone]  Primary Care Physician:  Juline Patch, MD Primary Gastroenterologist:  Dr. Allen Norris  Pre-Procedure History & Physical: HPI:  Melissa Huerta is a 57 y.o. female is here for a screening colonoscopy.   Past Medical History:  Diagnosis Date  . Allergy   . Arthritis    HANDS  . Depression   . GERD (gastroesophageal reflux disease)   . Headache    2-3/WEEK SINUS RELATED  . Pollen allergies    OUTDOOR    Past Surgical History:  Procedure Laterality Date  . COLONOSCOPY  2007    [facility] docs  . GALLBLADDER SURGERY    . GASTRIC BYPASS      Prior to Admission medications   Medication Sig Start Date End Date Taking? Authorizing Provider  fluticasone (FLONASE) 50 MCG/ACT nasal spray USE 1 SPRAY IN EACH NOSTRIL ONCE DAILY AS DIRECTED BY PHYSICIAN. 11/08/17  Yes Juline Patch, MD  Melatonin 3 MG CAPS Take 1 capsule by mouth at bedtime.   Yes [provider]  Multiple Vitamins-Minerals (CENTRUM SILVER 50+WOMEN PO) Take by mouth daily. AM   Yes [provider]  omeprazole (PRILOSEC) 40 MG capsule TAKE ONE (1) CAPSULE EACH DAY. 06/15/18  Yes Juline Patch, MD  sertraline (ZOLOFT) 50 MG tablet TAKE ONE (1) TABLET BY MOUTH ONCE DAILY 05/25/18  Yes Juline Patch, MD  traZODone (DESYREL) 50 MG tablet Take 0.5-1 tablets (25-50 mg total) by mouth at bedtime as needed for sleep. 05/25/18  Yes Juline Patch, MD  oxybutynin (DITROPAN-XL) 5 MG 24 hr tablet Take 1 tablet (5 mg total) by mouth at bedtime. Patient not taking: Reported on 06/27/2018 05/25/18   Juline Patch, MD    Allergies as of 06/12/2018 - Review Complete 05/25/2018  Allergen Reaction Noted  . Codeine Other (See Comments) 09/18/2015  . Penicillins Other (See Comments) 09/18/2015  . Percocet [oxycodone-acetaminophen] Nausea Only and Other (See Comments) 02/18/2016    Family History  Problem Relation  Age of Onset  . Hypertension Mother   . Hypertension Sister   . Breast cancer Neg Hx     Social History   Socioeconomic History  . Marital status: Single    Spouse name: Not on file  . Number of children: Not on file  . Years of education: Not on file  . Highest education level: Not on file  Occupational History  . Not on file  Social Needs  . Financial resource strain: Not on file  . Food insecurity:    Worry: Not on file    Inability: Not on file  . Transportation needs:    Medical: Not on file    Non-medical: Not on file  Tobacco Use  . Smoking status: Current Some Day Smoker    Packs/day: 0.25    Years: 30.00    Pack years: 7.50    Types: Cigarettes  . Smokeless tobacco: Never Used  . Tobacco comment: smokes socially, have soke 1 pack/week  Substance and Sexual Activity  . Alcohol use: Yes    Alcohol/week: 6.0 standard drinks    Types: 4 Cans of beer, 2 Shots of liquor per week  . Drug use: No  . Sexual activity: Not Currently  Lifestyle  . Physical activity:    Days per week: Not on file    Minutes per session: Not on file  . Stress: Not on file  Relationships  . Social connections:    Talks on phone: Not on file    Gets together: Not on file    Attends religious service: Not on file    Active member of club or organization: Not on file    Attends meetings of clubs or organizations: Not on file    Relationship status: Not on file  . Intimate partner violence:    Fear of current or ex partner: Not on file    Emotionally abused: Not on file    Physically abused: Not on file    Forced sexual activity: Not on file  Other Topics Concern  . Not on file  Social History Narrative  . Not on file    Review of Systems: See HPI, otherwise negative ROS  Physical Exam: BP (!) 124/91   Pulse 85   Temp 97.8 F (36.6 C) (Temporal)   Resp 16   Ht 5\' 3"  (1.6 m)   Wt 80.7 kg   SpO2 100%   BMI 31.53 kg/m  General:   Alert,  pleasant and cooperative in  NAD Head:  Normocephalic and atraumatic. Neck:  Supple; no masses or thyromegaly. Lungs:  Clear throughout to auscultation.    Heart:  Regular rate and rhythm. Abdomen:  Soft, nontender and nondistended. Normal bowel sounds, without guarding, and without rebound.   Neurologic:  Alert and  oriented x4;  grossly normal neurologically.  Impression/Plan: Melissa Huerta is now here to undergo a screening colonoscopy.  Risks, benefits, and alternatives regarding colonoscopy have been reviewed with the patient.  Questions have been answered.  All parties agreeable.

## 2018-07-05 NOTE — Op Note (Signed)
[facility] Gastroenterology Patient Name: Melissa Huerta Procedure Date: 07/05/2018 7:33 AM MRN: [id number] Account #: [id number] Date of Birth: [birth year] Admit Type: Outpatient Age: 57 Room: [facility] OR ROOM 01 Gender: Female Note Status: Finalized Procedure:            Colonoscopy Indications:          Screening for colorectal malignant neoplasm Providers:            Lucilla Lame MD, MD Referring MD:         Juline Patch, MD (Referring MD) Medicines:            Propofol per Anesthesia Complications:        No immediate complications. Procedure:            Pre-Anesthesia Assessment:                       - Prior to the procedure, a History and Physical was                        performed, and patient medications and allergies were                        reviewed. The patient's tolerance of previous                        anesthesia was also reviewed. The risks and benefits of                        the procedure and the sedation options and risks were                        discussed with the patient. All questions were                        answered, and informed consent was obtained. Prior                        Anticoagulants: The patient has taken no previous                        anticoagulant or antiplatelet agents. ASA Grade                        Assessment: II - A patient with mild systemic disease.                        After reviewing the risks and benefits, the patient was                        deemed in satisfactory condition to undergo the                        procedure.                       After obtaining informed consent, the colonoscope was                        passed under direct vision. Throughout the procedure,  the patient's blood pressure, pulse, and oxygen                        saturations were monitored continuously. The was                        introduced through the anus and advanced to the the                cecum, identified by appendiceal orifice and ileocecal                        valve. The colonoscopy was performed without                        difficulty. The patient tolerated the procedure well.                        The quality of the bowel preparation was excellent. Findings:      The perianal and digital rectal examinations were normal.      A 9 mm polyp was found in the ascending colon. The polyp was sessile.       Area was successfully injected with 5 mL saline with methylene blue for       a lift polypectomy. The polyp was removed with a hot snare. Resection       and retrieval were complete. To close a defect after polypectomy, one       hemostatic clip was successfully placed (MR conditional). There was no       bleeding at the end of the procedure.      A 4 mm polyp was found in the descending colon. The polyp was sessile.       The polyp was removed with a cold snare. Resection and retrieval were       complete.      Non-bleeding internal hemorrhoids were found during retroflexion. The       hemorrhoids were Grade I (internal hemorrhoids that do not prolapse). Impression:           - One 9 mm polyp in the ascending colon, removed with a                        hot snare. Resected and retrieved. Injected. Clip (MR                        conditional) was placed.                       - One 4 mm polyp in the descending colon, removed with                        a cold snare. Resected and retrieved.                       - Non-bleeding internal hemorrhoids. Recommendation:       - Discharge patient to home.                       - Resume previous diet.                       - Continue present medications.                       -  Await pathology results.                       - Repeat colonoscopy in 5 years for surveillance. Procedure Code(s):    --- Professional ---                       [phone], Colonoscopy, flexible; with removal of tumor(s),                         polyp(s), or other lesion(s) by snare technique                       45381, Colonoscopy, flexible; with directed submucosal                        injection(s), any substance Diagnosis Code(s):    --- Professional ---                       Z12.11, Encounter for screening for malignant neoplasm                        of colon                       D12.2, Benign neoplasm of ascending colon                       D12.4, Benign neoplasm of descending colon CPT copyright 2018 American Medical Association. All rights reserved. The codes documented in this report are preliminary and upon coder review may  be revised to meet current compliance requirements. Lucilla Lame MD, MD 07/05/2018 8:11:07 AM This report has been signed electronically. Number of Addenda: 0 Note Initiated On: 07/05/2018 7:33 AM Scope Withdrawal Time: 0 hours 13 minutes 53 seconds  Total Procedure Duration: 0 hours 16 minutes 53 seconds       [facility]

## 2018-07-05 NOTE — Transfer of Care (Signed)
Immediate Anesthesia Transfer of Care Note  Patient: Melissa Huerta  Procedure(s) Performed: COLONOSCOPY WITH BIOPSIES (N/A Rectum) POLYPECTOMY (N/A Rectum)  Patient Location: PACU  Anesthesia Type: General  Level of Consciousness: awake, alert  and patient cooperative  Airway and Oxygen Therapy: Patient Spontanous Breathing and Patient connected to supplemental oxygen  Post-op Assessment: Post-op Vital signs reviewed, Patient's Cardiovascular Status Stable, Respiratory Function Stable, Patent Airway and No signs of Nausea or vomiting  Post-op Vital Signs: Reviewed and stable  Complications: No apparent anesthesia complications

## 2018-07-05 NOTE — Anesthesia Preprocedure Evaluation (Signed)
Anesthesia Evaluation  Patient identified by MRN, date of birth, ID band Patient awake    Reviewed: Allergy & Precautions, H&P , NPO status , Patient's Chart, lab work & pertinent test results  History of Anesthesia Complications Negative for: history of anesthetic complications  Airway Mallampati: II  TM Distance: >3 FB Neck ROM: full    Dental no notable dental hx.    Pulmonary Current Smoker,    Pulmonary exam normal        Cardiovascular negative cardio ROS Normal cardiovascular exam     Neuro/Psych    GI/Hepatic Neg liver ROS, Medicated,  Endo/Other  negative endocrine ROS  Renal/GU negative Renal ROS     Musculoskeletal   Abdominal   Peds  Hematology negative hematology ROS (+)   Anesthesia Other Findings   Reproductive/Obstetrics negative OB ROS                             Anesthesia Physical Anesthesia Plan  ASA: II  Anesthesia Plan: General   Post-op Pain Management:    Induction:   PONV Risk Score and Plan:   Airway Management Planned:   Additional Equipment:   Intra-op Plan:   Post-operative Plan:   Informed Consent: I have reviewed the patients History and Physical, chart, labs and discussed the procedure including the risks, benefits and alternatives for the proposed anesthesia with the patient or authorized representative who has indicated his/her understanding and acceptance.     Plan Discussed with:   Anesthesia Plan Comments:         Anesthesia Quick Evaluation

## 2018-07-05 NOTE — Anesthesia Procedure Notes (Signed)
Performed by: Louden Houseworth, CRNA Pre-anesthesia Checklist: Patient identified, Emergency Drugs available, Suction available, Timeout performed and Patient being monitored Patient Re-evaluated:Patient Re-evaluated prior to induction Oxygen Delivery Method: Nasal cannula Placement Confirmation: positive ETCO2       

## 2018-07-06 ENCOUNTER — Encounter: Payer: Self-pay | Admitting: Gastroenterology

## 2018-07-09 ENCOUNTER — Encounter: Payer: Self-pay | Admitting: Gastroenterology

## 2018-08-22 ENCOUNTER — Ambulatory Visit: Payer: BC Managed Care – PPO | Admitting: Internal Medicine

## 2018-08-22 ENCOUNTER — Encounter: Payer: Self-pay | Admitting: Internal Medicine

## 2018-08-22 VITALS — BP 130/76 | HR 84 | Temp 98.4°F | Ht 63.0 in | Wt 180.0 lb

## 2018-08-22 DIAGNOSIS — J0100 Acute maxillary sinusitis, unspecified: Secondary | ICD-10-CM

## 2018-08-22 MED ORDER — BENZONATATE 100 MG PO CAPS: 100.0000 mg | ORAL_CAPSULE | Freq: Three times a day (TID) | ORAL | 0 refills | Status: DC

## 2018-08-22 MED ORDER — DOXYCYCLINE HYCLATE 100 MG PO TABS: 100.0000 mg | ORAL_TABLET | Freq: Two times a day (BID) | ORAL | 0 refills | Status: AC

## 2018-08-22 NOTE — Progress Notes (Signed)
Date:  08/22/2018   Name:  Melissa Huerta   DOB:  02-Jul-1961   MRN:  [id number]   Chief Complaint: Sinusitis (X 1 week. Facial pressure. Chills off and on. Cough- thick production. Headache (neck))  Sinusitis  This is a new problem. The current episode started in the past 7 days. The problem is unchanged. There has been no fever. The pain is moderate. Associated symptoms include congestion, coughing, headaches and sinus pressure. Pertinent negatives include no chills, ear pain or shortness of breath. Past treatments include spray decongestants. The treatment provided mild relief.    Review of Systems  Constitutional: Negative for chills.  HENT: Positive for congestion and sinus pressure. Negative for ear pain.   Respiratory: Positive for cough. Negative for shortness of breath and wheezing.   Cardiovascular: Negative for chest pain and palpitations.  Allergic/Immunologic: Positive for environmental allergies.  Neurological: Positive for headaches.    Patient Active Problem List   Diagnosis Date Noted  . Encounter for screening colonoscopy   . Benign neoplasm of ascending colon   . Benign neoplasm of descending colon   . Recurrent major depressive disorder, in full remission ([place]) 11/08/2017  . Gastroesophageal reflux disease 11/08/2017  . Influenza vaccine needed 11/14/2016    Allergies  Allergen Reactions  . Banana Nausea And Vomiting  . Codeine Other (See Comments)    STOMACH ISSUES  . Penicillins Other (See Comments)    LOCAL REACTION, SWELLING  . Percocet [Oxycodone-Acetaminophen] Nausea Only and Other (See Comments)    DARVOCET,DARVON, AND OTHER PAIN MEDICINES    Past Surgical History:  Procedure Laterality Date  . COLONOSCOPY  2007    [facility] docs  . COLONOSCOPY WITH PROPOFOL N/A 07/05/2018   Procedure: COLONOSCOPY WITH BIOPSIES;  Surgeon: Lucilla Lame, MD;  Location: [place];  Service: Endoscopy;  Laterality: N/A;  request early  . GALLBLADDER SURGERY     . GASTRIC BYPASS    . POLYPECTOMY N/A 07/05/2018   Procedure: POLYPECTOMY;  Surgeon: Lucilla Lame, MD;  Location: [place];  Service: Endoscopy;  Laterality: N/A;    Social History   Tobacco Use  . Smoking status: Current Some Day Smoker    Packs/day: 0.25    Years: 30.00    Pack years: 7.50    Types: Cigarettes  . Smokeless tobacco: Never Used  . Tobacco comment: smokes socially,   Substance Use Topics  . Alcohol use: Yes    Alcohol/week: 6.0 standard drinks    Types: 4 Cans of beer, 2 Shots of liquor per week  . Drug use: No     Medication list has been reviewed and updated.  Current Meds  Medication Sig  . fluticasone (FLONASE) 50 MCG/ACT nasal spray USE 1 SPRAY IN EACH NOSTRIL ONCE DAILY AS DIRECTED BY PHYSICIAN.  Marland Kitchen Melatonin 3 MG CAPS Take 1 capsule by mouth at bedtime.  . Multiple Vitamins-Minerals (CENTRUM SILVER 50+WOMEN PO) Take by mouth daily. AM  . omeprazole (PRILOSEC) 40 MG capsule TAKE ONE (1) CAPSULE EACH DAY.  Marland Kitchen oxybutynin (DITROPAN-XL) 5 MG 24 hr tablet Take 1 tablet (5 mg total) by mouth at bedtime.  . sertraline (ZOLOFT) 50 MG tablet TAKE ONE (1) TABLET BY MOUTH ONCE DAILY  . traZODone (DESYREL) 50 MG tablet Take 0.5-1 tablets (25-50 mg total) by mouth at bedtime as needed for sleep.    PHQ 2/9 Scores 05/25/2018 12/14/2015 11/11/2015 09/18/2015  PHQ - 2 Score 0 0 0 0  PHQ- 9 Score 3 - - -  Physical Exam  Constitutional: She is oriented to person, place, and time. She appears well-developed and well-nourished.  HENT:  Right Ear: External ear and ear canal normal. Tympanic membrane is not erythematous and not retracted.  Left Ear: External ear and ear canal normal. Tympanic membrane is not erythematous and not retracted.  Nose: Right sinus exhibits maxillary sinus tenderness and frontal sinus tenderness. Left sinus exhibits maxillary sinus tenderness and frontal sinus tenderness.  Mouth/Throat: Uvula is midline and mucous membranes are  normal. No oral lesions. Posterior oropharyngeal erythema present. No oropharyngeal exudate.  Neck: Normal range of motion.  Cardiovascular: Normal rate, regular rhythm and normal heart sounds.  Pulmonary/Chest: Effort normal and breath sounds normal. She has no wheezes. She has no rales.  Lymphadenopathy:    She has no cervical adenopathy.  Neurological: She is alert and oriented to person, place, and time.    BP 130/76 (BP Location: Left Arm, Patient Position: Sitting, Cuff Size: Normal)   Pulse 84   Temp 98.4 F (36.9 C) (Oral)   Ht 5\' 3"  (1.6 m)   Wt 180 lb (81.6 kg)   SpO2 98%   BMI 31.89 kg/m   Assessment and Plan: 1. Acute non-recurrent maxillary sinusitis Continue Nasonex, increase fluids - doxycycline (VIBRA-TABS) 100 MG tablet; Take 1 tablet (100 mg total) by mouth 2 (two) times daily for 10 days.  Dispense: 20 tablet; Refill: 0 - benzonatate (TESSALON) 100 MG capsule; Take 1 capsule (100 mg total) by mouth 3 (three) times daily.  Dispense: 30 capsule; Refill: 0   Partially dictated using Editor, commissioning. Any errors are unintentional.  Halina Maidens, MD [facility]  08/22/2018

## 2018-10-29 ENCOUNTER — Ambulatory Visit (INDEPENDENT_AMBULATORY_CARE_PROVIDER_SITE_OTHER): Payer: BC Managed Care – PPO | Admitting: Family Medicine

## 2018-10-29 ENCOUNTER — Encounter: Payer: Self-pay | Admitting: Family Medicine

## 2018-10-29 VITALS — BP 120/80 | HR 80 | Temp 98.5°F | Ht 63.0 in | Wt 181.0 lb

## 2018-10-29 DIAGNOSIS — J0100 Acute maxillary sinusitis, unspecified: Secondary | ICD-10-CM | POA: Diagnosis not present

## 2018-10-29 MED ORDER — AZITHROMYCIN 250 MG PO TABS: ORAL_TABLET | ORAL | 1 refills | Status: DC

## 2018-10-29 MED ORDER — BENZONATATE 100 MG PO CAPS: 100.0000 mg | ORAL_CAPSULE | Freq: Three times a day (TID) | ORAL | 0 refills | Status: DC

## 2018-10-29 NOTE — Progress Notes (Signed)
Date:  10/29/2018   Name:  Melissa Huerta   DOB:  03-12-1961   MRN:  [id number]   Chief Complaint: Sinusitis (cough with little discolored production and cong from head and chest- taking mucinex otc)  Sinusitis  This is a new problem. The current episode started in the past 7 days (about a week ago). The problem has been gradually worsening since onset. There has been no fever. She is experiencing no pain. Associated symptoms include chills, congestion, coughing, headaches, a hoarse voice, shortness of breath, sneezing and a sore throat. Pertinent negatives include no diaphoresis, ear pain, neck pain, sinus pressure or swollen glands. Past treatments include oral decongestants. The treatment provided mild relief.    Review of Systems  Constitutional: Positive for chills. Negative for diaphoresis, fatigue, fever and unexpected weight change.  HENT: Positive for congestion, hoarse voice, sneezing and sore throat. Negative for ear discharge, ear pain, rhinorrhea and sinus pressure.   Eyes: Negative for photophobia, pain, discharge, redness and itching.  Respiratory: Positive for cough and shortness of breath. Negative for wheezing and stridor.   Gastrointestinal: Negative for abdominal pain, blood in stool, constipation, diarrhea, nausea and vomiting.  Endocrine: Negative for cold intolerance, heat intolerance, polydipsia, polyphagia and polyuria.  Genitourinary: Negative for dysuria, flank pain, frequency, hematuria, menstrual problem, pelvic pain, urgency, vaginal bleeding and vaginal discharge.  Musculoskeletal: Negative for arthralgias, back pain, myalgias and neck pain.  Skin: Negative for rash.  Allergic/Immunologic: Negative for environmental allergies and food allergies.  Neurological: Positive for headaches. Negative for dizziness, weakness, light-headedness and numbness.  Hematological: Negative for adenopathy. Does not bruise/bleed easily.  Psychiatric/Behavioral: Negative for  dysphoric mood. The patient is not nervous/anxious.     Patient Active Problem List   Diagnosis Date Noted  . Encounter for screening colonoscopy   . Benign neoplasm of ascending colon   . Benign neoplasm of descending colon   . Recurrent major depressive disorder, in full remission ([place]) 11/08/2017  . Gastroesophageal reflux disease 11/08/2017  . Influenza vaccine needed 11/14/2016    Allergies  Allergen Reactions  . Banana Nausea And Vomiting  . Codeine Other (See Comments)    STOMACH ISSUES  . Penicillins Other (See Comments)    LOCAL REACTION, SWELLING  . Percocet [Oxycodone-Acetaminophen] Nausea Only and Other (See Comments)    DARVOCET,DARVON, AND OTHER PAIN MEDICINES    Past Surgical History:  Procedure Laterality Date  . COLONOSCOPY  2007    [facility] docs  . COLONOSCOPY WITH PROPOFOL N/A 07/05/2018   Procedure: COLONOSCOPY WITH BIOPSIES;  Surgeon: Lucilla Lame, MD;  Location: [place];  Service: Endoscopy;  Laterality: N/A;  request early  . GALLBLADDER SURGERY    . GASTRIC BYPASS    . NASAL SINUS SURGERY  2014  . POLYPECTOMY N/A 07/05/2018   Procedure: POLYPECTOMY;  Surgeon: Lucilla Lame, MD;  Location: [place];  Service: Endoscopy;  Laterality: N/A;    Social History   Tobacco Use  . Smoking status: Current Some Day Smoker    Packs/day: 0.25    Years: 30.00    Pack years: 7.50    Types: Cigarettes  . Smokeless tobacco: Never Used  . Tobacco comment: smokes socially,   Substance Use Topics  . Alcohol use: Yes    Alcohol/week: 6.0 standard drinks    Types: 4 Cans of beer, 2 Shots of liquor per week  . Drug use: No     Medication list has been reviewed and updated.  Current  Meds  Medication Sig  . benzonatate (TESSALON) 100 MG capsule Take 1 capsule (100 mg total) by mouth 3 (three) times daily.  . fluticasone (FLONASE) 50 MCG/ACT nasal spray USE 1 SPRAY IN EACH NOSTRIL ONCE DAILY AS DIRECTED BY PHYSICIAN.  Marland Kitchen Melatonin 3 MG CAPS  Take 1 capsule by mouth at bedtime.  . Multiple Vitamins-Minerals (CENTRUM SILVER 50+WOMEN PO) Take by mouth daily. AM  . omeprazole (PRILOSEC) 40 MG capsule TAKE ONE (1) CAPSULE EACH DAY.  Marland Kitchen oxybutynin (DITROPAN-XL) 5 MG 24 hr tablet Take 1 tablet (5 mg total) by mouth at bedtime.  . sertraline (ZOLOFT) 50 MG tablet TAKE ONE (1) TABLET BY MOUTH ONCE DAILY  . traZODone (DESYREL) 50 MG tablet Take 0.5-1 tablets (25-50 mg total) by mouth at bedtime as needed for sleep.    PHQ 2/9 Scores 10/29/2018 05/25/2018 12/14/2015 11/11/2015  PHQ - 2 Score 0 0 0 0  PHQ- 9 Score 0 3 - -    Physical Exam Nursing note reviewed.  Constitutional:      General: She is not in acute distress.    Appearance: She is not diaphoretic.  HENT:     Head: Normocephalic and atraumatic.     Right Ear: Tympanic membrane, ear canal and external ear normal.     Left Ear: Tympanic membrane, ear canal and external ear normal.     Nose: Nose normal.     Mouth/Throat:     Mouth: Mucous membranes are moist.  Eyes:     General:        Right eye: No discharge.        Left eye: No discharge.     Conjunctiva/sclera: Conjunctivae normal.     Pupils: Pupils are equal, round, and reactive to light.  Neck:     Musculoskeletal: Normal range of motion and neck supple.     Thyroid: No thyromegaly.     Vascular: No JVD.  Cardiovascular:     Rate and Rhythm: Normal rate and regular rhythm.     Heart sounds: Normal heart sounds. No murmur. No friction rub. No gallop.   Pulmonary:     Effort: Pulmonary effort is normal.     Breath sounds: Normal breath sounds. No stridor. No wheezing, rhonchi or rales.  Abdominal:     General: Bowel sounds are normal.     Palpations: Abdomen is soft. There is no mass.     Tenderness: There is no abdominal tenderness. There is no guarding.  Musculoskeletal: Normal range of motion.  Lymphadenopathy:     Cervical: No cervical adenopathy.  Skin:    General: Skin is warm and dry.  Neurological:      Mental Status: She is alert.     Deep Tendon Reflexes: Reflexes are normal and symmetric.     BP 120/80   Pulse 80   Temp 98.5 F (36.9 C) (Oral)   Ht 5\' 3"  (1.6 m)   Wt 181 lb (82.1 kg)   BMI 32.06 kg/m   Assessment and Plan: 1. Acute non-recurrent maxillary sinusitis Acute.  Persistent.  Will initiate azithromycin 2 today then 1 a day for 4 days and cover cough with benzo Nate Tessalon Perles 100 mg 3 times a day as needed cough fluids have been encouraged. - benzonatate (TESSALON) 100 MG capsule; Take 1 capsule (100 mg total) by mouth 3 (three) times daily.  Dispense: 30 capsule; Refill: 0 - azithromycin (ZITHROMAX) 250 MG tablet; 2 today then 1 a day for 4 days  Dispense: 6 tablet; Refill: 1

## 2018-11-13 ENCOUNTER — Other Ambulatory Visit: Payer: Self-pay | Admitting: Family Medicine

## 2018-12-03 ENCOUNTER — Other Ambulatory Visit: Payer: Self-pay | Admitting: Family Medicine

## 2018-12-03 DIAGNOSIS — J301 Allergic rhinitis due to pollen: Secondary | ICD-10-CM

## 2019-01-10 ENCOUNTER — Other Ambulatory Visit: Payer: Self-pay | Admitting: Family Medicine

## 2019-01-14 ENCOUNTER — Ambulatory Visit: Payer: Self-pay | Admitting: Family Medicine

## 2019-01-16 ENCOUNTER — Ambulatory Visit: Payer: BC Managed Care – PPO | Admitting: Family Medicine

## 2019-01-17 ENCOUNTER — Ambulatory Visit: Payer: Self-pay | Admitting: Family Medicine

## 2019-02-15 ENCOUNTER — Encounter: Payer: Self-pay | Admitting: Family Medicine

## 2019-02-15 ENCOUNTER — Other Ambulatory Visit: Payer: Self-pay

## 2019-02-15 ENCOUNTER — Ambulatory Visit: Payer: BC Managed Care – PPO | Admitting: Family Medicine

## 2019-02-15 VITALS — BP 138/82 | HR 78 | Resp 16 | Ht 63.0 in | Wt 177.0 lb

## 2019-02-15 DIAGNOSIS — F3342 Major depressive disorder, recurrent, in full remission: Secondary | ICD-10-CM

## 2019-02-15 DIAGNOSIS — R5383 Other fatigue: Secondary | ICD-10-CM

## 2019-02-15 DIAGNOSIS — F5101 Primary insomnia: Secondary | ICD-10-CM

## 2019-02-15 DIAGNOSIS — K219 Gastro-esophageal reflux disease without esophagitis: Secondary | ICD-10-CM | POA: Diagnosis not present

## 2019-02-15 DIAGNOSIS — J301 Allergic rhinitis due to pollen: Secondary | ICD-10-CM | POA: Diagnosis not present

## 2019-02-15 MED ORDER — SERTRALINE HCL 50 MG PO TABS: ORAL_TABLET | ORAL | 1 refills | Status: DC

## 2019-02-15 MED ORDER — TRAZODONE HCL 50 MG PO TABS: 25.0000 mg | ORAL_TABLET | Freq: Every day | ORAL | 1 refills | Status: DC

## 2019-02-15 MED ORDER — OMEPRAZOLE 40 MG PO CPDR: DELAYED_RELEASE_CAPSULE | ORAL | 1 refills | Status: DC

## 2019-02-15 MED ORDER — FLUTICASONE PROPIONATE 50 MCG/ACT NA SUSP: NASAL | 1 refills | Status: DC

## 2019-02-15 NOTE — Progress Notes (Signed)
Date:  02/15/2019   Name:  Melissa Huerta   DOB:  09/29/1960   MRN:  [id number]   Chief Complaint: Depression (phq9- 4) and Insomnia (Meds helping when taking 2. )  Depression         This is a chronic problem.  The current episode started more than 1 year ago.   The onset quality is gradual.   The problem has been gradually improving since onset.  Associated symptoms include fatigue and appetite change.  Associated symptoms include no decreased concentration, no helplessness, no hopelessness, does not have insomnia, not irritable, no restlessness, no decreased interest, no body aches, no myalgias, no headaches, no indigestion, not sad and no suicidal ideas.  Past treatments include SSRIs - Selective serotonin reuptake inhibitors (deseryl/hs).  Compliance with treatment is good.  Previous treatment provided mild relief.  Past medical history includes thyroid problem.   Insomnia  Primary symptoms: no fragmented sleep, no sleep disturbance, no difficulty falling asleep, no somnolence, no frequent awakening, no premature morning awakening, no malaise/fatigue, no napping.   The current episode started in the past 7 days. The treatment provided moderate relief. PMH includes: depression.  Thyroid Problem  Presents for initial visit. Symptoms include cold intolerance, depressed mood, diaphoresis, dry skin, fatigue, hair loss, heat intolerance, hoarse voice, nail problem and weight loss. Patient reports no anxiety, constipation, diarrhea, leg swelling, menstrual problem, palpitations, tremors, visual change or weight gain. The symptoms have been worsening.    Review of Systems  Constitutional: Positive for appetite change, diaphoresis, fatigue and weight loss. Negative for chills, fever, malaise/fatigue and weight gain.  HENT: Positive for hoarse voice. Negative for drooling, ear discharge, ear pain and sore throat.   Respiratory: Negative for cough, shortness of breath and wheezing.    Cardiovascular: Negative for chest pain, palpitations and leg swelling.  Gastrointestinal: Negative for abdominal pain, blood in stool, constipation, diarrhea and nausea.  Endocrine: Positive for cold intolerance and heat intolerance. Negative for polydipsia.  Genitourinary: Negative for dysuria, frequency, hematuria, menstrual problem and urgency.  Musculoskeletal: Negative for back pain, myalgias and neck pain.  Skin: Negative for rash.  Allergic/Immunologic: Negative for environmental allergies.  Neurological: Negative for dizziness, tremors and headaches.  Hematological: Does not bruise/bleed easily.  Psychiatric/Behavioral: Positive for depression. Negative for decreased concentration, sleep disturbance and suicidal ideas. The patient is not nervous/anxious and does not have insomnia.     Patient Active Problem List   Diagnosis Date Noted  . Encounter for screening colonoscopy   . Benign neoplasm of ascending colon   . Benign neoplasm of descending colon   . Recurrent major depressive disorder, in full remission ([place]) 11/08/2017  . Gastroesophageal reflux disease 11/08/2017  . Influenza vaccine needed 11/14/2016    Allergies  Allergen Reactions  . Banana Nausea And Vomiting  . Codeine Other (See Comments)    STOMACH ISSUES  . Penicillins Other (See Comments)    LOCAL REACTION, SWELLING  . Percocet [Oxycodone-Acetaminophen] Nausea Only and Other (See Comments)    DARVOCET,DARVON, AND OTHER PAIN MEDICINES    Past Surgical History:  Procedure Laterality Date  . COLONOSCOPY  2007    [facility] docs  . COLONOSCOPY WITH PROPOFOL N/A 07/05/2018   Procedure: COLONOSCOPY WITH BIOPSIES;  Surgeon: Lucilla Lame, MD;  Location: [place];  Service: Endoscopy;  Laterality: N/A;  request early  . GALLBLADDER SURGERY    . GASTRIC BYPASS    . NASAL SINUS SURGERY  2014  . POLYPECTOMY N/A  07/05/2018   Procedure: POLYPECTOMY;  Surgeon: Lucilla Lame, MD;  Location: [place];   Service: Endoscopy;  Laterality: N/A;    Social History   Tobacco Use  . Smoking status: Current Some Day Smoker    Packs/day: 0.25    Years: 30.00    Pack years: 7.50    Types: Cigarettes  . Smokeless tobacco: Never Used  . Tobacco comment: smokes socially,   Substance Use Topics  . Alcohol use: Yes    Alcohol/week: 6.0 standard drinks    Types: 4 Cans of beer, 2 Shots of liquor per week  . Drug use: No     Medication list has been reviewed and updated.  Current Meds  Medication Sig  . azithromycin (ZITHROMAX) 250 MG tablet 2 today then 1 a day for 4 days  . benzonatate (TESSALON) 100 MG capsule Take 1 capsule (100 mg total) by mouth 3 (three) times daily.  . fluticasone (FLONASE) 50 MCG/ACT nasal spray 1 SPRAY IN EACH NOSTRIL EVERY DAY  . Melatonin 3 MG CAPS Take 1 capsule by mouth at bedtime.  . Multiple Vitamins-Minerals (CENTRUM SILVER 50+WOMEN PO) Take by mouth daily. AM  . omeprazole (PRILOSEC) 40 MG capsule TAKE 1 CAPSULE BY MOUTH EACH DAY  . oxybutynin (DITROPAN-XL) 5 MG 24 hr tablet Take 1 tablet (5 mg total) by mouth at bedtime.  . sertraline (ZOLOFT) 50 MG tablet TAKE ONE (1) TABLET BY MOUTH ONCE DAILY  . traZODone (DESYREL) 50 MG tablet Take 0.5-1 tablets (25-50 mg total) by mouth at bedtime as needed for sleep. (Patient taking differently: Take 50 mg by mouth at bedtime as needed for sleep. Takes 2 nightly.)    PHQ 2/9 Scores 02/15/2019 10/29/2018 05/25/2018 12/14/2015  PHQ - 2 Score 0 0 0 0  PHQ- 9 Score 4 0 3 -    BP Readings from Last 3 Encounters:  02/15/19 138/82  10/29/18 120/80  08/22/18 130/76    Physical Exam Constitutional:      General: She is not irritable.    Wt Readings from Last 3 Encounters:  02/15/19 177 lb (80.3 kg)  10/29/18 181 lb (82.1 kg)  08/22/18 180 lb (81.6 kg)    BP 138/82   Pulse 78   Resp 16   Ht 5\' 3"  (1.6 m)   Wt 177 lb (80.3 kg)   SpO2 98%   BMI 31.35 kg/m   Assessment and Plan: 1. Recurrent major depressive  disorder, in full remission ([place]) Chronic.  Controlled.  Continue sertraline 50 mg once a day. - sertraline (ZOLOFT) 50 MG tablet; One a day  Dispense: 90 tablet; Refill: 1  2. Seasonal allergic rhinitis due to pollen Chronic.  Controlled.  Continue fluticasone as needed for allergic rhinitis. - fluticasone (FLONASE) 50 MCG/ACT nasal spray; 1 SPRAY IN EACH NOSTRIL EVERY DAY  Dispense: 16 g; Refill: 1  3. Primary insomnia Chronic.  Controlled.  Continue trazodone usually at 50 mg nightly to initiate sleep. - traZODone (DESYREL) 50 MG tablet; Take 0.5-1 tablets (25-50 mg total) by mouth at bedtime.  Dispense: 90 tablet; Refill: 1  4. Gastroesophageal reflux disease, esophagitis presence not specified Chronic.  Controlled.  Continue omeprazole 40 mg once a day. - omeprazole (PRILOSEC) 40 MG capsule; TAKE 1 CAPSULE BY MOUTH EACH DAY  Dispense: 90 capsule; Refill: 1  5. Fatigue, unspecified type New onset will initiate evaluation with lab work including renal function, TSH, CBC, and hepatic function panel.  Has not discussed the possibility of needing  to increase antidepressant medication although patient says she is not depressed at this time. - Renal Function Panel - TSH - CBC with Differential/Platelet - Hepatic Function Panel (6)

## 2019-02-16 LAB — CBC WITH DIFFERENTIAL/PLATELET
Basophils Absolute: 0 10*3/uL (ref 0.0–0.2)
Basos: 0 %
EOS (ABSOLUTE): 0.1 10*3/uL (ref 0.0–0.4)
Eos: 2 %
Hematocrit: 42.4 % (ref 34.0–46.6)
Hemoglobin: 14.2 g/dL (ref 11.1–15.9)
Immature Grans (Abs): 0 10*3/uL (ref 0.0–0.1)
Immature Granulocytes: 0 %
Lymphocytes Absolute: 1.9 10*3/uL (ref 0.7–3.1)
Lymphs: 26 %
MCH: 31.9 pg (ref 26.6–33.0)
MCHC: 33.5 g/dL (ref 31.5–35.7)
MCV: 95 fL (ref 79–97)
Monocytes Absolute: 0.5 10*3/uL (ref 0.1–0.9)
Monocytes: 7 %
Neutrophils Absolute: 4.9 10*3/uL (ref 1.4–7.0)
Neutrophils: 65 %
Platelets: 322 10*3/uL (ref 150–450)
RBC: 4.45 x10E6/uL (ref 3.77–5.28)
RDW: 13.7 % (ref 11.7–15.4)
WBC: 7.4 10*3/uL (ref 3.4–10.8)

## 2019-02-16 LAB — RENAL FUNCTION PANEL
Albumin: 4.3 g/dL (ref 3.8–4.9)
BUN/Creatinine Ratio: 15 (ref 9–23)
BUN: 16 mg/dL (ref 6–24)
CO2: 21 mmol/L (ref 20–29)
Calcium: 9.9 mg/dL (ref 8.7–10.2)
Chloride: 102 mmol/L (ref 96–106)
Creatinine, Ser: 1.06 mg/dL — ABNORMAL HIGH (ref 0.57–1.00)
GFR calc Af Amer: 66 mL/min/{1.73_m2} (ref >59)
GFR calc non Af Amer: 58 mL/min/{1.73_m2} — ABNORMAL LOW (ref >59)
Glucose: 85 mg/dL (ref 65–99)
Phosphorus: 4.8 mg/dL — ABNORMAL HIGH (ref 3.0–4.3)
Potassium: 4.6 mmol/L (ref 3.5–5.2)
Sodium: 142 mmol/L (ref 134–144)

## 2019-02-16 LAB — HEPATIC FUNCTION PANEL (6)
ALT: 38 IU/L — ABNORMAL HIGH (ref 0–32)
AST: 19 IU/L (ref 0–40)
Alkaline Phosphatase: 128 IU/L — ABNORMAL HIGH (ref 39–117)
Bilirubin Total: <0.2 mg/dL (ref 0.0–1.2)
Bilirubin, Direct: 0.06 mg/dL (ref 0.00–0.40)

## 2019-02-16 LAB — TSH: TSH: 1.23 u[IU]/mL (ref 0.450–4.500)

## 2019-08-07 ENCOUNTER — Other Ambulatory Visit: Payer: Self-pay | Admitting: Family Medicine

## 2019-08-07 DIAGNOSIS — K219 Gastro-esophageal reflux disease without esophagitis: Secondary | ICD-10-CM

## 2019-09-18 ENCOUNTER — Ambulatory Visit (INDEPENDENT_AMBULATORY_CARE_PROVIDER_SITE_OTHER): Payer: BC Managed Care – PPO | Admitting: Family Medicine

## 2019-09-18 ENCOUNTER — Encounter: Payer: Self-pay | Admitting: Family Medicine

## 2019-09-18 VITALS — Ht 63.0 in | Wt 182.0 lb

## 2019-09-18 DIAGNOSIS — R05 Cough: Secondary | ICD-10-CM

## 2019-09-18 DIAGNOSIS — J0100 Acute maxillary sinusitis, unspecified: Secondary | ICD-10-CM | POA: Diagnosis not present

## 2019-09-18 DIAGNOSIS — R059 Cough, unspecified: Secondary | ICD-10-CM

## 2019-09-18 MED ORDER — AZITHROMYCIN 250 MG PO TABS: ORAL_TABLET | ORAL | 1 refills | Status: DC

## 2019-09-18 MED ORDER — BENZONATATE 100 MG PO CAPS: 100.0000 mg | ORAL_CAPSULE | Freq: Three times a day (TID) | ORAL | 0 refills | Status: DC

## 2019-09-18 NOTE — Progress Notes (Signed)
Date:  09/18/2019   Name:  Melissa Huerta   DOB:  07-18-1961   MRN:  [id number]   Chief Complaint: No chief complaint on file.  I connected withthis patient, Melissa Huerta, by telephoneat the patient's home.  I verified that I am speaking with the correct person using two identifiers. This visit was conducted via telephone due to the Covid-19 outbreak from my office at [facility] in [place], [place]. I discussed the limitations, risks, security and privacy concerns of performing an evaluation and management service by telephone. I also discussed with the patient that there may be a patient responsible charge related to this service. The patient expressed understanding and agreed to proceed.  Sinusitis This is a new problem. The current episode started in the past 7 days (sunday). The problem is unchanged. There has been no fever. The pain is mild (top of head). Associated symptoms include congestion, coughing, headaches and sinus pressure. Pertinent negatives include no chills, diaphoresis, ear pain, hoarse voice, neck pain, shortness of breath, sneezing, sore throat or swollen glands. (Maxillary sinus pain) The treatment provided moderate relief.    Lab Results  Component Value Date   CREATININE 1.06 (H) 02/15/2019   BUN 16 02/15/2019   NA 142 02/15/2019   K 4.6 02/15/2019   CL 102 02/15/2019   CO2 21 02/15/2019   Lab Results  Component Value Date   CHOL 240 (H) 05/25/2018   HDL 63 05/25/2018   LDLCALC 157 (H) 05/25/2018   TRIG 98 05/25/2018   CHOLHDL 3.8 05/25/2018   Lab Results  Component Value Date   TSH 1.230 02/15/2019   No results found for: HGBA1C   Review of Systems  Constitutional: Negative for chills, diaphoresis and fever.  HENT: Positive for congestion and sinus pressure. Negative for drooling, ear discharge, ear pain, hoarse voice, sneezing and sore throat.   Respiratory: Positive for cough. Negative for shortness of breath and wheezing.     Cardiovascular: Negative for chest pain, palpitations and leg swelling.  Gastrointestinal: Negative for abdominal pain, blood in stool, constipation, diarrhea and nausea.  Endocrine: Negative for polydipsia.  Genitourinary: Negative for dysuria, frequency, hematuria and urgency.  Musculoskeletal: Negative for back pain, myalgias and neck pain.  Skin: Negative for rash.  Allergic/Immunologic: Negative for environmental allergies.  Neurological: Positive for headaches. Negative for dizziness.  Hematological: Does not bruise/bleed easily.  Psychiatric/Behavioral: Negative for suicidal ideas. The patient is not nervous/anxious.     Patient Active Problem List   Diagnosis Date Noted  . Primary insomnia 02/15/2019  . Seasonal allergic rhinitis due to pollen 02/15/2019  . Encounter for screening colonoscopy   . Benign neoplasm of ascending colon   . Benign neoplasm of descending colon   . Recurrent major depressive disorder, in full remission ([place]) 11/08/2017  . Gastroesophageal reflux disease 11/08/2017  . Influenza vaccine needed 11/14/2016    Allergies  Allergen Reactions  . Banana Nausea And Vomiting  . Codeine Other (See Comments)    STOMACH ISSUES  . Penicillins Other (See Comments)    LOCAL REACTION, SWELLING  . Percocet [Oxycodone-Acetaminophen] Nausea Only and Other (See Comments)    DARVOCET,DARVON, AND OTHER PAIN MEDICINES    Past Surgical History:  Procedure Laterality Date  . COLONOSCOPY  2007    [facility] docs  . COLONOSCOPY WITH PROPOFOL N/A 07/05/2018   Procedure: COLONOSCOPY WITH BIOPSIES;  Surgeon: Lucilla Lame, MD;  Location: [place];  Service: Endoscopy;  Laterality: N/A;  request early  .  GALLBLADDER SURGERY    . GASTRIC BYPASS    . NASAL SINUS SURGERY  2014  . POLYPECTOMY N/A 07/05/2018   Procedure: POLYPECTOMY;  Surgeon: Lucilla Lame, MD;  Location: [place];  Service: Endoscopy;  Laterality: N/A;    Social History   Tobacco Use  .  Smoking status: Current Some Day Smoker    Packs/day: 0.25    Years: 30.00    Pack years: 7.50    Types: Cigarettes  . Smokeless tobacco: Never Used  . Tobacco comment: smokes socially,   Substance Use Topics  . Alcohol use: Yes    Alcohol/week: 6.0 standard drinks    Types: 4 Cans of beer, 2 Shots of liquor per week  . Drug use: No     Medication list has been reviewed and updated.  Current Meds  Medication Sig  . fluticasone (FLONASE) 50 MCG/ACT nasal spray 1 SPRAY IN EACH NOSTRIL EVERY DAY  . Melatonin 3 MG CAPS Take 1 capsule by mouth at bedtime.  . Multiple Vitamins-Minerals (CENTRUM SILVER 50+WOMEN PO) Take by mouth daily. AM  . omeprazole (PRILOSEC) 40 MG capsule TAKE 1 CAPSULE BY MOUTH EACH DAY.  . sertraline (ZOLOFT) 50 MG tablet One a day  . traZODone (DESYREL) 50 MG tablet Take 0.5-1 tablets (25-50 mg total) by mouth at bedtime.    PHQ 2/9 Scores 09/18/2019 02/15/2019 10/29/2018 05/25/2018  PHQ - 2 Score 0 0 0 0  PHQ- 9 Score 1 4 0 3    BP Readings from Last 3 Encounters:  02/15/19 138/82  10/29/18 120/80  08/22/18 130/76    Physical Exam Vitals and nursing note reviewed.     Wt Readings from Last 3 Encounters:  09/18/19 182 lb (82.6 kg)  02/15/19 177 lb (80.3 kg)  10/29/18 181 lb (82.1 kg)    Ht 5\' 3"  (1.6 m)   Wt 182 lb (82.6 kg)   BMI 32.24 kg/m   Assessment and Plan:   1. Acute non-recurrent maxillary sinusitis Patient visit was conducted as a televisit.  Acute.  Uncontrolled.  Stable.  We will initiate azithromycin to 50 mg 2 today followed by 1 a day for 4 days. - azithromycin (ZITHROMAX) 250 MG tablet; 2 today then 1 a day for 4 days  Dispense: 6 tablet; Refill: 1 - benzonatate (TESSALON) 100 MG capsule; Take 1 capsule (100 mg total) by mouth 3 (three) times daily.  Dispense: 30 capsule; Refill: 0  2. Cough Patient with cough and we will initiate Tessalon Perles 100 mg as needed - benzonatate (TESSALON) 100 MG capsule; Take 1 capsule (100  mg total) by mouth 3 (three) times daily.  Dispense: 30 capsule; Refill: 0

## 2019-10-30 ENCOUNTER — Other Ambulatory Visit: Payer: Self-pay | Admitting: Family Medicine

## 2019-10-30 DIAGNOSIS — F5101 Primary insomnia: Secondary | ICD-10-CM

## 2019-10-30 DIAGNOSIS — K219 Gastro-esophageal reflux disease without esophagitis: Secondary | ICD-10-CM

## 2019-11-08 ENCOUNTER — Ambulatory Visit: Payer: Self-pay | Admitting: Family Medicine

## 2019-12-03 ENCOUNTER — Ambulatory Visit: Payer: BC Managed Care – PPO | Admitting: Family Medicine

## 2019-12-05 ENCOUNTER — Ambulatory Visit: Payer: BC Managed Care – PPO | Admitting: Family Medicine

## 2019-12-05 ENCOUNTER — Encounter: Payer: Self-pay | Admitting: Family Medicine

## 2019-12-05 ENCOUNTER — Other Ambulatory Visit: Payer: Self-pay

## 2019-12-05 VITALS — BP 122/64 | HR 68 | Ht 63.0 in | Wt 173.0 lb

## 2019-12-05 DIAGNOSIS — F5101 Primary insomnia: Secondary | ICD-10-CM | POA: Diagnosis not present

## 2019-12-05 DIAGNOSIS — R69 Illness, unspecified: Secondary | ICD-10-CM

## 2019-12-05 DIAGNOSIS — K219 Gastro-esophageal reflux disease without esophagitis: Secondary | ICD-10-CM | POA: Diagnosis not present

## 2019-12-05 DIAGNOSIS — F3342 Major depressive disorder, recurrent, in full remission: Secondary | ICD-10-CM

## 2019-12-05 DIAGNOSIS — J301 Allergic rhinitis due to pollen: Secondary | ICD-10-CM

## 2019-12-05 DIAGNOSIS — E7801 Familial hypercholesterolemia: Secondary | ICD-10-CM

## 2019-12-05 DIAGNOSIS — E78019 Familial hypercholesterolemia, unspecified: Secondary | ICD-10-CM

## 2019-12-05 MED ORDER — FLUTICASONE PROPIONATE 50 MCG/ACT NA SUSP: NASAL | 1 refills | Status: DC

## 2019-12-05 MED ORDER — SERTRALINE HCL 50 MG PO TABS: ORAL_TABLET | ORAL | 1 refills | Status: DC

## 2019-12-05 MED ORDER — MIRTAZAPINE 15 MG PO TABS: 15.0000 mg | ORAL_TABLET | Freq: Every day | ORAL | 1 refills | Status: DC

## 2019-12-05 MED ORDER — OMEPRAZOLE 40 MG PO CPDR: DELAYED_RELEASE_CAPSULE | ORAL | 1 refills | Status: DC

## 2019-12-05 NOTE — Progress Notes (Signed)
Date:  12/05/2019   Name:  Melissa Huerta   DOB:  09/07/1961   MRN:  [id number]   Chief Complaint: Depression (PHQ9-1 and GAD7- 4), Gastroesophageal Reflux, Allergic Rhinitis , and Insomnia  Depression        This is a chronic problem.  The current episode started more than 1 year ago.   The onset quality is gradual.   The problem occurs intermittently.  The problem has been gradually improving since onset.  Associated symptoms include insomnia and restlessness.  Associated symptoms include no decreased concentration, no fatigue, no helplessness, no hopelessness, not irritable, no decreased interest, no appetite change, no body aches, no myalgias, no headaches, no indigestion, not sad and no suicidal ideas.  Past treatments include SSRIs - Selective serotonin reuptake inhibitors.  Compliance with treatment is variable.  Previous treatment provided moderate relief.  Past medical history includes anxiety.   Gastroesophageal Reflux She reports no abdominal pain, no belching, no chest pain, no choking, no coughing, no dysphagia, no early satiety, no globus sensation, no heartburn, no hoarse voice, no nausea, no sore throat, no stridor or no wheezing. This is a recurrent problem. The current episode started in the past 7 days. The problem has been gradually improving. Pertinent negatives include no fatigue.  Insomnia Primary symptoms: fragmented sleep, no sleep disturbance, difficulty falling asleep, no somnolence, no frequent awakening, no premature morning awakening, no malaise/fatigue, no napping.   The current episode started more than one year. The onset quality is gradual. The problem occurs intermittently. PMH includes: depression.  Anxiety Symptoms include insomnia and restlessness. Patient reports no chest pain, decreased concentration, dizziness, nausea, nervous/anxious behavior, palpitations, shortness of breath or suicidal ideas.      Lab Results  Component Value Date   CREATININE  1.06 (H) 02/15/2019   BUN 16 02/15/2019   NA 142 02/15/2019   K 4.6 02/15/2019   CL 102 02/15/2019   CO2 21 02/15/2019   Lab Results  Component Value Date   CHOL 240 (H) 05/25/2018   HDL 63 05/25/2018   LDLCALC 157 (H) 05/25/2018   TRIG 98 05/25/2018   CHOLHDL 3.8 05/25/2018   Lab Results  Component Value Date   TSH 1.230 02/15/2019   No results found for: HGBA1C Lab Results  Component Value Date   WBC 7.4 02/15/2019   HGB 14.2 02/15/2019   HCT 42.4 02/15/2019   MCV 95 02/15/2019   PLT 322 02/15/2019   Lab Results  Component Value Date   ALT 38 (H) 02/15/2019   AST 19 02/15/2019   ALKPHOS 128 (H) 02/15/2019   BILITOT <0.2 02/15/2019     Review of Systems  Constitutional: Negative for appetite change, chills, fatigue, fever and malaise/fatigue.  HENT: Negative for drooling, ear discharge, ear pain, hoarse voice and sore throat.   Respiratory: Negative for cough, choking, shortness of breath and wheezing.   Cardiovascular: Negative for chest pain, palpitations and leg swelling.  Gastrointestinal: Negative for abdominal pain, blood in stool, constipation, diarrhea, dysphagia, heartburn and nausea.  Endocrine: Negative for polydipsia.  Genitourinary: Negative for dysuria, frequency, hematuria and urgency.  Musculoskeletal: Negative for back pain, myalgias and neck pain.  Skin: Negative for rash.  Allergic/Immunologic: Negative for environmental allergies.  Neurological: Negative for dizziness and headaches.  Hematological: Does not bruise/bleed easily.  Psychiatric/Behavioral: Positive for depression. Negative for decreased concentration, sleep disturbance and suicidal ideas. The patient has insomnia. The patient is not nervous/anxious.     Patient Active Problem  List   Diagnosis Date Noted  . Primary insomnia 02/15/2019  . Seasonal allergic rhinitis due to pollen 02/15/2019  . Encounter for screening colonoscopy   . Benign neoplasm of ascending colon   . Benign  neoplasm of descending colon   . Recurrent major depressive disorder, in full remission ([place]) 11/08/2017  . Gastroesophageal reflux disease 11/08/2017  . Influenza vaccine needed 11/14/2016    Allergies  Allergen Reactions  . Banana Nausea And Vomiting  . Codeine Other (See Comments)    STOMACH ISSUES  . Penicillins Other (See Comments)    LOCAL REACTION, SWELLING  . Percocet [Oxycodone-Acetaminophen] Nausea Only and Other (See Comments)    DARVOCET,DARVON, AND OTHER PAIN MEDICINES    Past Surgical History:  Procedure Laterality Date  . COLONOSCOPY  2007    [facility] docs  . COLONOSCOPY WITH PROPOFOL N/A 07/05/2018   Procedure: COLONOSCOPY WITH BIOPSIES;  Surgeon: Lucilla Lame, MD;  Location: [place];  Service: Endoscopy;  Laterality: N/A;  request early  . GALLBLADDER SURGERY    . GASTRIC BYPASS    . NASAL SINUS SURGERY  2014  . POLYPECTOMY N/A 07/05/2018   Procedure: POLYPECTOMY;  Surgeon: Lucilla Lame, MD;  Location: [place];  Service: Endoscopy;  Laterality: N/A;    Social History   Tobacco Use  . Smoking status: Current Some Day Smoker    Packs/day: 0.25    Years: 30.00    Pack years: 7.50    Types: Cigarettes  . Smokeless tobacco: Never Used  . Tobacco comment: smokes socially,   Substance Use Topics  . Alcohol use: Yes    Alcohol/week: 6.0 standard drinks    Types: 4 Cans of beer, 2 Shots of liquor per week  . Drug use: No     Medication list has been reviewed and updated.  Current Meds  Medication Sig  . fluticasone (FLONASE) 50 MCG/ACT nasal spray 1 SPRAY IN EACH NOSTRIL EVERY DAY  . Melatonin 3 MG CAPS Take 1 capsule by mouth at bedtime.  . Multiple Vitamins-Minerals (CENTRUM SILVER 50+WOMEN PO) Take by mouth daily. AM  . omeprazole (PRILOSEC) 40 MG capsule TAKE (1) CAPSULE BY MOUTH EVERY DAY  . sertraline (ZOLOFT) 50 MG tablet One a day  . traZODone (DESYREL) 50 MG tablet TAKE 1/2 TO 1 TABLET BY MOUTH AT BEDTIME.    PHQ 2/9  Scores 12/05/2019 09/18/2019 02/15/2019 10/29/2018  PHQ - 2 Score 0 0 0 0  PHQ- 9 Score 1 1 4  0    BP Readings from Last 3 Encounters:  12/05/19 122/64  02/15/19 138/82  10/29/18 120/80    Physical Exam Vitals and nursing note reviewed.  Constitutional:      General: She is not irritable.    Appearance: She is well-developed.  HENT:     Head: Normocephalic.     Right Ear: Tympanic membrane, ear canal and external ear normal.     Left Ear: Tympanic membrane, ear canal and external ear normal.     Nose: Nose normal.     Mouth/Throat:     Mouth: Mucous membranes are moist.  Eyes:     General: Lids are everted, no foreign bodies appreciated. No scleral icterus.       Left eye: No foreign body or hordeolum.     Conjunctiva/sclera: Conjunctivae normal.     Right eye: Right conjunctiva is not injected.     Left eye: Left conjunctiva is not injected.     Pupils: Pupils are equal,  round, and reactive to light.  Neck:     Thyroid: No thyromegaly.     Vascular: No JVD.     Trachea: No tracheal deviation.  Cardiovascular:     Rate and Rhythm: Normal rate and regular rhythm.     Heart sounds: Normal heart sounds. No murmur. No friction rub. No gallop.   Pulmonary:     Effort: Pulmonary effort is normal. No respiratory distress.     Breath sounds: Normal breath sounds. No wheezing, rhonchi or rales.  Abdominal:     General: Bowel sounds are normal.     Palpations: Abdomen is soft. There is no mass.     Tenderness: There is no abdominal tenderness. There is no guarding or rebound.  Musculoskeletal:        General: No tenderness. Normal range of motion.     Cervical back: Normal range of motion and neck supple.  Lymphadenopathy:     Cervical: No cervical adenopathy.  Skin:    General: Skin is warm.     Capillary Refill: Capillary refill takes less than 2 seconds.     Findings: No erythema or rash.  Neurological:     Mental Status: She is alert and oriented to person, place, and  time.     Cranial Nerves: No cranial nerve deficit.     Deep Tendon Reflexes: Reflexes normal.  Psychiatric:        Mood and Affect: Mood is not anxious or depressed.     Wt Readings from Last 3 Encounters:  12/05/19 173 lb (78.5 kg)  09/18/19 182 lb (82.6 kg)  02/15/19 177 lb (80.3 kg)    BP 122/64   Pulse 68   Ht 5\' 3"  (1.6 m)   Wt 173 lb (78.5 kg)   BMI 30.65 kg/m   Assessment and Plan:  1. Gastroesophageal reflux disease Chronic.  Controlled.  Stable.  Continue omeprazole 40 mg once a day. - omeprazole (PRILOSEC) 40 MG capsule; TAKE (1) CAPSULE BY MOUTH EVERY DAY  Dispense: 90 capsule; Refill: 1  2. Seasonal allergic rhinitis due to pollen Chronic.  Controlled.  Stable.  Continue fluticasone nasal spray 1 spray each nostril daily. - fluticasone (FLONASE) 50 MCG/ACT nasal spray; 1 SPRAY IN EACH NOSTRIL EVERY DAY  Dispense: 16 g; Refill: 1  3. Recurrent major depressive disorder, in full remission ([place]) Chronic.  Controlled.  Stable.  PHQ noted to be 1.  Gad score is 4 primarily due to restlessness.  Continue sertraline 50 mg once a day. - sertraline (ZOLOFT) 50 MG tablet; One a day  Dispense: 90 tablet; Refill: 1  4. Primary insomnia Patient is having difficulty taking trazodone secondary to headache and it marginally working for her insomnia.  We will switch to Remeron 15 mg 1 nightly for sleep. - mirtazapine (REMERON) 15 MG tablet; Take 1 tablet (15 mg total) by mouth at bedtime.  Dispense: 30 tablet; Refill: 1  5. Taking medication for chronic disease Patient on multiple medications and will check renal function panel for side effect profile. - Renal Function Panel  6. Familial hypercholesterolemia Review of lipid panel notes that there has been elevated LDL and will check lipid panel for evaluation. - Lipid Panel With LDL/HDL Ratio

## 2019-12-06 LAB — RENAL FUNCTION PANEL
Albumin: 4.3 g/dL (ref 3.8–4.9)
BUN/Creatinine Ratio: 15 (ref 9–23)
BUN: 12 mg/dL (ref 6–24)
CO2: 22 mmol/L (ref 20–29)
Calcium: 9.6 mg/dL (ref 8.7–10.2)
Chloride: 106 mmol/L (ref 96–106)
Creatinine, Ser: 0.78 mg/dL (ref 0.57–1.00)
GFR calc Af Amer: 96 mL/min/{1.73_m2} (ref >59)
GFR calc non Af Amer: 83 mL/min/{1.73_m2} (ref >59)
Glucose: 90 mg/dL (ref 65–99)
Phosphorus: 3.9 mg/dL (ref 3.0–4.3)
Potassium: 4.5 mmol/L (ref 3.5–5.2)
Sodium: 144 mmol/L (ref 134–144)

## 2019-12-06 LAB — LIPID PANEL WITH LDL/HDL RATIO
Cholesterol, Total: 215 mg/dL — ABNORMAL HIGH (ref 100–199)
HDL: 79 mg/dL (ref >39)
LDL Chol Calc (NIH): 122 mg/dL — ABNORMAL HIGH (ref 0–99)
LDL/HDL Ratio: 1.5 ratio (ref 0.0–3.2)
Triglycerides: 80 mg/dL (ref 0–149)
VLDL Cholesterol Cal: 14 mg/dL (ref 5–40)

## 2020-01-29 ENCOUNTER — Ambulatory Visit: Payer: BC Managed Care – PPO | Admitting: Family Medicine

## 2020-01-30 ENCOUNTER — Ambulatory Visit: Payer: BC Managed Care – PPO | Admitting: Family Medicine

## 2020-02-03 ENCOUNTER — Telehealth: Payer: Self-pay | Admitting: Family Medicine

## 2020-02-03 NOTE — Telephone Encounter (Signed)
Cannot have VV for this- pt was notified of this last week and cancelled visits. Please call and schedule for in office visit

## 2020-02-03 NOTE — Telephone Encounter (Unsigned)
Copied from [place] [id number]. Topic: General - Other >> Feb 03, 2020  8:42 AM Keene Breath wrote: Reason for CRM: Patient would like a virtual appt. Asap if possible.  Having sinus issues with headache, cough, sore throat.  Completed the covid screening.  Please call patient to let her know if there is an availability at [phone]

## 2020-02-04 ENCOUNTER — Other Ambulatory Visit: Payer: Self-pay

## 2020-02-04 ENCOUNTER — Ambulatory Visit: Payer: BC Managed Care – PPO | Admitting: Family Medicine

## 2020-02-04 ENCOUNTER — Encounter: Payer: Self-pay | Admitting: Family Medicine

## 2020-02-04 VITALS — BP 130/70 | HR 68 | Ht 63.0 in | Wt 174.0 lb

## 2020-02-04 DIAGNOSIS — F3342 Major depressive disorder, recurrent, in full remission: Secondary | ICD-10-CM

## 2020-02-04 DIAGNOSIS — J0100 Acute maxillary sinusitis, unspecified: Secondary | ICD-10-CM

## 2020-02-04 DIAGNOSIS — F5101 Primary insomnia: Secondary | ICD-10-CM | POA: Diagnosis not present

## 2020-02-04 DIAGNOSIS — E7801 Familial hypercholesterolemia: Secondary | ICD-10-CM

## 2020-02-04 DIAGNOSIS — H6122 Impacted cerumen, left ear: Secondary | ICD-10-CM

## 2020-02-04 DIAGNOSIS — F17210 Nicotine dependence, cigarettes, uncomplicated: Secondary | ICD-10-CM | POA: Diagnosis not present

## 2020-02-04 MED ORDER — MIRTAZAPINE 15 MG PO TABS: 15.0000 mg | ORAL_TABLET | Freq: Every day | ORAL | 1 refills | Status: DC

## 2020-02-04 MED ORDER — SERTRALINE HCL 50 MG PO TABS: ORAL_TABLET | ORAL | 1 refills | Status: DC

## 2020-02-04 MED ORDER — AZITHROMYCIN 250 MG PO TABS: ORAL_TABLET | ORAL | 0 refills | Status: DC

## 2020-02-04 MED ORDER — CARBAMIDE PEROXIDE 6.5 % OT SOLN: 5.0000 [drp] | Freq: Two times a day (BID) | OTIC | 0 refills | Status: DC

## 2020-02-04 NOTE — Progress Notes (Signed)
Date:  02/04/2020   Name:  Melissa Huerta   DOB:  09/21/1960   MRN:  [id number]   Chief Complaint: Sinusitis (pressure in head- more on L) side. having some balance issues. Hurting in L) ear- has notice some blood in ear. drainage.) and Insomnia (needs refill on mirtazepine if staying on it)  Sinusitis This is a new problem. The current episode started 1 to 4 weeks ago (10 days ago). The problem has been gradually worsening since onset. There has been no fever. Her pain is at a severity of 2/10. The pain is mild. Associated symptoms include congestion, ear pain, headaches and sinus pressure. Pertinent negatives include no chills, coughing, diaphoresis, hoarse voice, neck pain, shortness of breath, sneezing, sore throat or swollen glands. (Post nasal) Past treatments include oral decongestants. The treatment provided moderate relief.  Insomnia Primary symptoms: fragmented sleep, no sleep disturbance, difficulty falling asleep, no somnolence, no frequent awakening, no premature morning awakening.  The current episode started more than one year. The problem occurs intermittently.    Lab Results  Component Value Date   CREATININE 0.78 12/05/2019   BUN 12 12/05/2019   NA 144 12/05/2019   K 4.5 12/05/2019   CL 106 12/05/2019   CO2 22 12/05/2019   Lab Results  Component Value Date   CHOL 215 (H) 12/05/2019   HDL 79 12/05/2019   LDLCALC 122 (H) 12/05/2019   TRIG 80 12/05/2019   CHOLHDL 3.8 05/25/2018   Lab Results  Component Value Date   TSH 1.230 02/15/2019   No results found for: HGBA1C Lab Results  Component Value Date   WBC 7.4 02/15/2019   HGB 14.2 02/15/2019   HCT 42.4 02/15/2019   MCV 95 02/15/2019   PLT 322 02/15/2019   Lab Results  Component Value Date   ALT 38 (H) 02/15/2019   AST 19 02/15/2019   ALKPHOS 128 (H) 02/15/2019   BILITOT <0.2 02/15/2019     Review of Systems  Constitutional: Negative for chills and diaphoresis.  HENT: Positive for congestion,  ear pain and sinus pressure. Negative for hoarse voice, sneezing and sore throat.   Respiratory: Negative for cough and shortness of breath.   Musculoskeletal: Negative for neck pain.  Neurological: Positive for headaches.  Psychiatric/Behavioral: Negative for sleep disturbance. The patient has insomnia.     Patient Active Problem List   Diagnosis Date Noted  . Primary insomnia 02/15/2019  . Seasonal allergic rhinitis due to pollen 02/15/2019  . Encounter for screening colonoscopy   . Benign neoplasm of ascending colon   . Benign neoplasm of descending colon   . Recurrent major depressive disorder, in full remission ([place]) 11/08/2017  . Gastroesophageal reflux disease 11/08/2017  . Influenza vaccine needed 11/14/2016    Allergies  Allergen Reactions  . Banana Nausea And Vomiting  . Codeine Other (See Comments)    STOMACH ISSUES  . Penicillins Other (See Comments)    LOCAL REACTION, SWELLING  . Percocet [Oxycodone-Acetaminophen] Nausea Only and Other (See Comments)    DARVOCET,DARVON, AND OTHER PAIN MEDICINES    Past Surgical History:  Procedure Laterality Date  . COLONOSCOPY  2007    [facility] docs  . COLONOSCOPY WITH PROPOFOL N/A 07/05/2018   Procedure: COLONOSCOPY WITH BIOPSIES;  Surgeon: Lucilla Lame, MD;  Location: [place];  Service: Endoscopy;  Laterality: N/A;  request early  . GALLBLADDER SURGERY    . GASTRIC BYPASS    . NASAL SINUS SURGERY  2014  . POLYPECTOMY  N/A 07/05/2018   Procedure: POLYPECTOMY;  Surgeon: Lucilla Lame, MD;  Location: [place];  Service: Endoscopy;  Laterality: N/A;    Social History   Tobacco Use  . Smoking status: Current Some Day Smoker    Packs/day: 0.25    Years: 30.00    Pack years: 7.50    Types: Cigarettes  . Smokeless tobacco: Never Used  . Tobacco comment: smokes socially,   Substance Use Topics  . Alcohol use: Yes    Alcohol/week: 6.0 standard drinks    Types: 4 Cans of beer, 2 Shots of liquor per week  .  Drug use: No     Medication list has been reviewed and updated.  Current Meds  Medication Sig  . fluticasone (FLONASE) 50 MCG/ACT nasal spray 1 SPRAY IN EACH NOSTRIL EVERY DAY  . mirtazapine (REMERON) 15 MG tablet Take 1 tablet (15 mg total) by mouth at bedtime.  . Multiple Vitamins-Minerals (CENTRUM SILVER 50+WOMEN PO) Take by mouth daily. AM  . omeprazole (PRILOSEC) 40 MG capsule TAKE (1) CAPSULE BY MOUTH EVERY DAY  . sertraline (ZOLOFT) 50 MG tablet One a day  . [DISCONTINUED] Melatonin 3 MG CAPS Take 1 capsule by mouth at bedtime.    PHQ 2/9 Scores 02/04/2020 12/05/2019 09/18/2019 02/15/2019  PHQ - 2 Score 0 0 0 0  PHQ- 9 Score 4 1 1 4     BP Readings from Last 3 Encounters:  02/04/20 130/70  12/05/19 122/64  02/15/19 138/82    Physical Exam  Wt Readings from Last 3 Encounters:  02/04/20 174 lb (78.9 kg)  12/05/19 173 lb (78.5 kg)  09/18/19 182 lb (82.6 kg)    BP 130/70   Pulse 68   Ht 5\' 3"  (1.6 m)   Wt 174 lb (78.9 kg)   BMI 30.82 kg/m   Assessment and Plan:  1. Primary insomnia Chronic.  Controlled.  Stable.  Continue Remeron 15 mg 1 nightly. - mirtazapine (REMERON) 15 MG tablet; Take 1 tablet (15 mg total) by mouth at bedtime.  Dispense: 30 tablet; Refill: 1  2. Recurrent major depressive disorder, in full remission ([place]) Chronic.  Controlled.  Stable.  PHQ is 6 with a gad score of 1.  Continue sertraline 50 mg once a day and will recheck in 6 months. - sertraline (ZOLOFT) 50 MG tablet; One a day  Dispense: 90 tablet; Refill: 1  3. Familial hypercholesterolemia Chronic.  Controlled.  Patient currently is using diet with relatively good control will check lipid panel as needed.  4. Cigarette nicotine dependence without complication Patient has been advised of the health risks of smoking and counseled concerning cessation of tobacco products. I spent over 3 minutes for discussion and to answer questions.  5. Acute non-recurrent maxillary sinusitis Acute.   Persistent.  Will initiate azithromycin to 50 mg 2 today followed by 1 a day for 4 days. - azithromycin (ZITHROMAX) 250 MG tablet; 2 today then 1 a day for 4 days  Dispense: 6 tablet; Refill: 0  6. Impacted cerumen of left ear Patient has mild impaction of cerumen in the left ear which she cannot get out with a washcloth so we have suggested that she obtain some Debrox with a low bulb syringe to gently wash out and remove the wax. - carbamide peroxide (DEBROX) 6.5 % OTIC solution; Place 5 drops into both ears 2 (two) times daily.  Dispense: 15 mL; Refill: 0

## 2020-06-03 ENCOUNTER — Other Ambulatory Visit: Payer: Self-pay | Admitting: Family Medicine

## 2020-06-03 DIAGNOSIS — K219 Gastro-esophageal reflux disease without esophagitis: Secondary | ICD-10-CM

## 2020-06-10 ENCOUNTER — Ambulatory Visit: Payer: BC Managed Care – PPO | Admitting: Family Medicine

## 2020-06-10 ENCOUNTER — Other Ambulatory Visit: Payer: Self-pay

## 2020-06-10 ENCOUNTER — Encounter: Payer: Self-pay | Admitting: Family Medicine

## 2020-06-10 VITALS — BP 130/68 | HR 68 | Ht 63.0 in | Wt 168.0 lb

## 2020-06-10 DIAGNOSIS — Z23 Encounter for immunization: Secondary | ICD-10-CM

## 2020-06-10 DIAGNOSIS — K219 Gastro-esophageal reflux disease without esophagitis: Secondary | ICD-10-CM

## 2020-06-10 DIAGNOSIS — K5901 Slow transit constipation: Secondary | ICD-10-CM

## 2020-06-10 DIAGNOSIS — F3342 Major depressive disorder, recurrent, in full remission: Secondary | ICD-10-CM

## 2020-06-10 DIAGNOSIS — J301 Allergic rhinitis due to pollen: Secondary | ICD-10-CM | POA: Diagnosis not present

## 2020-06-10 DIAGNOSIS — F5101 Primary insomnia: Secondary | ICD-10-CM

## 2020-06-10 MED ORDER — DOCUSATE SODIUM 50 MG PO CAPS: 50.0000 mg | ORAL_CAPSULE | Freq: Every day | ORAL | 1 refills | Status: DC

## 2020-06-10 MED ORDER — OMEPRAZOLE 40 MG PO CPDR: DELAYED_RELEASE_CAPSULE | ORAL | 1 refills | Status: DC

## 2020-06-10 MED ORDER — FLUTICASONE PROPIONATE 50 MCG/ACT NA SUSP: NASAL | 11 refills | Status: DC

## 2020-06-10 MED ORDER — SERTRALINE HCL 50 MG PO TABS: ORAL_TABLET | ORAL | 1 refills | Status: DC

## 2020-06-10 MED ORDER — MIRTAZAPINE 15 MG PO TABS: 15.0000 mg | ORAL_TABLET | Freq: Every day | ORAL | 1 refills | Status: DC

## 2020-06-10 NOTE — Progress Notes (Signed)
Date:  06/10/2020   Name:  Melissa Huerta   DOB:  [birth year]   MRN:  [id number]   Chief Complaint: Allergic Rhinitis , Gastroesophageal Reflux, Depression, Insomnia, and Flu Vaccine  Gastroesophageal Reflux She complains of heartburn. She reports no abdominal pain, no belching, no chest pain, no choking, no coughing, no dysphagia, no early satiety, no globus sensation, no hoarse voice, no nausea, no sore throat, no stridor or no wheezing. This is a chronic problem. The current episode started more than 1 year ago. The problem occurs occasionally. The problem has been waxing and waning. The symptoms are aggravated by certain foods. Pertinent negatives include no anemia, fatigue, melena, muscle weakness, orthopnea or weight loss. She has tried a PPI for the symptoms.  Depression        This is a chronic problem.  The current episode started more than 1 year ago.   The problem has been gradually improving since onset.  Associated symptoms include insomnia.  Associated symptoms include no decreased concentration, no fatigue, no helplessness, no hopelessness, not irritable, no restlessness, no decreased interest, no appetite change, no body aches, no myalgias, no headaches, no indigestion, not sad and no suicidal ideas.  Past treatments include SSRIs - Selective serotonin reuptake inhibitors.  Compliance with treatment is good.  Previous treatment provided moderate relief. Insomnia Primary symptoms: no fragmented sleep, no sleep disturbance, no difficulty falling asleep, no somnolence, no frequent awakening, no premature morning awakening, no malaise/fatigue, no napping.   The current episode started more than one year. The problem occurs intermittently. The problem has been gradually improving since onset. The treatment provided mild relief. PMH includes: depression.  URI  Chronicity: for allergies. Pertinent negatives include no abdominal pain, chest pain, congestion, coughing, diarrhea, dysuria, ear  pain, headaches, nausea, rash, rhinorrhea, sneezing, sore throat, vomiting or wheezing.  Constipation This is a new problem. The current episode started more than 1 month ago. The problem has been waxing and waning since onset. The patient is not on a high fiber diet. There has been adequate water intake (recent). Associated symptoms include bloating and hemorrhoids. Pertinent negatives include no abdominal pain, back pain, diarrhea, fever, melena, nausea, vomiting or weight loss.    Lab Results  Component Value Date   CREATININE 0.78 12/05/2019   BUN 12 12/05/2019   NA 144 12/05/2019   K 4.5 12/05/2019   CL 106 12/05/2019   CO2 22 12/05/2019   Lab Results  Component Value Date   CHOL 215 (H) 12/05/2019   HDL 79 12/05/2019   LDLCALC 122 (H) 12/05/2019   TRIG 80 12/05/2019   CHOLHDL 3.8 05/25/2018   Lab Results  Component Value Date   TSH 1.230 02/15/2019   No results found for: HGBA1C Lab Results  Component Value Date   WBC 7.4 02/15/2019   HGB 14.2 02/15/2019   HCT 42.4 02/15/2019   MCV 95 02/15/2019   PLT 322 02/15/2019   Lab Results  Component Value Date   ALT 38 (H) 02/15/2019   AST 19 02/15/2019   ALKPHOS 128 (H) 02/15/2019   BILITOT <0.2 02/15/2019     Review of Systems  Constitutional: Negative.  Negative for appetite change, chills, fatigue, fever, malaise/fatigue, unexpected weight change and weight loss.  HENT: Negative for congestion, ear discharge, ear pain, hoarse voice, rhinorrhea, sinus pressure, sneezing and sore throat.   Eyes: Negative for photophobia, pain, discharge, redness and itching.  Respiratory: Negative for cough, choking, shortness of breath, wheezing and  stridor.   Cardiovascular: Negative for chest pain.  Gastrointestinal: Positive for anal bleeding, bloating, constipation, heartburn and hemorrhoids. Negative for abdominal pain, diarrhea, dysphagia, melena, nausea and vomiting.  Endocrine: Negative for cold intolerance, heat  intolerance, polydipsia, polyphagia and polyuria.  Genitourinary: Negative for dysuria, flank pain, frequency, hematuria, menstrual problem, pelvic pain, urgency, vaginal bleeding and vaginal discharge.  Musculoskeletal: Negative for arthralgias, back pain, myalgias and muscle weakness.  Skin: Negative for rash.  Allergic/Immunologic: Negative for environmental allergies and food allergies.  Neurological: Negative for dizziness, weakness, light-headedness, numbness and headaches.  Hematological: Negative for adenopathy. Does not bruise/bleed easily.  Psychiatric/Behavioral: Positive for depression. Negative for decreased concentration, dysphoric mood, sleep disturbance and suicidal ideas. The patient has insomnia. The patient is not nervous/anxious.     Patient Active Problem List   Diagnosis Date Noted  . Primary insomnia 02/15/2019  . Seasonal allergic rhinitis due to pollen 02/15/2019  . Encounter for screening colonoscopy   . Benign neoplasm of ascending colon   . Benign neoplasm of descending colon   . Recurrent major depressive disorder, in full remission ([place]) 11/08/2017  . Gastroesophageal reflux disease 11/08/2017  . Influenza vaccine needed 11/14/2016    Allergies  Allergen Reactions  . Banana Nausea And Vomiting  . Codeine Other (See Comments)    STOMACH ISSUES  . Penicillins Other (See Comments)    LOCAL REACTION, SWELLING  . Percocet [Oxycodone-Acetaminophen] Nausea Only and Other (See Comments)    DARVOCET,DARVON, AND OTHER PAIN MEDICINES    Past Surgical History:  Procedure Laterality Date  . COLONOSCOPY  2007    [facility] docs  . COLONOSCOPY WITH PROPOFOL N/A 07/05/2018   Procedure: COLONOSCOPY WITH BIOPSIES;  Surgeon: Lucilla Lame, MD;  Location: [place];  Service: Endoscopy;  Laterality: N/A;  request early  . GALLBLADDER SURGERY    . GASTRIC BYPASS    . NASAL SINUS SURGERY  2014  . POLYPECTOMY N/A 07/05/2018   Procedure: POLYPECTOMY;  Surgeon: Lucilla Lame, MD;  Location: [place];  Service: Endoscopy;  Laterality: N/A;    Social History   Tobacco Use  . Smoking status: Current Some Day Smoker    Packs/day: 0.25    Years: 30.00    Pack years: 7.50    Types: Cigarettes  . Smokeless tobacco: Never Used  . Tobacco comment: smokes socially,   Vaping Use  . Vaping Use: Never used  Substance Use Topics  . Alcohol use: Yes    Alcohol/week: 6.0 standard drinks    Types: 4 Cans of beer, 2 Shots of liquor per week  . Drug use: No     Medication list has been reviewed and updated.  Current Meds  Medication Sig  . carbamide peroxide (DEBROX) 6.5 % OTIC solution Place 5 drops into both ears 2 (two) times daily.  . fluticasone (FLONASE) 50 MCG/ACT nasal spray 1 SPRAY IN EACH NOSTRIL EVERY DAY  . mirtazapine (REMERON) 15 MG tablet Take 1 tablet (15 mg total) by mouth at bedtime.  . Multiple Vitamins-Minerals (CENTRUM SILVER 50+WOMEN PO) Take by mouth daily. AM  . omeprazole (PRILOSEC) 40 MG capsule TAKE (1) CAPSULE BY MOUTH EVERY DAY  . sertraline (ZOLOFT) 50 MG tablet One a day    PHQ 2/9 Scores 06/10/2020 02/04/2020 12/05/2019 09/18/2019  PHQ - 2 Score 0 0 0 0  PHQ- 9 Score 0 4 1 1     GAD 7 : Generalized Anxiety Score 06/10/2020 02/04/2020 12/05/2019 09/18/2019  Nervous, Anxious, on Edge 0  0 1 0  Control/stop worrying 0 0 1 0  Worry too much - different things 0 1 0 0  Trouble relaxing 0 0 1 0  Restless 0 0 0 0  Easily annoyed or irritable 1 0 1 1  Afraid - awful might happen 0 1 0 1  Total GAD 7 Score 1 2 4 2   Anxiety Difficulty - Somewhat difficult Not difficult at all Not difficult at all    BP Readings from Last 3 Encounters:  06/10/20 130/68  02/04/20 130/70  12/05/19 122/64    Physical Exam Vitals and nursing note reviewed.  Constitutional:      General: She is not irritable.    Appearance: She is well-developed.  HENT:     Head: Normocephalic.     Right Ear: Tympanic membrane, ear canal and  external ear normal.     Left Ear: Tympanic membrane, ear canal and external ear normal.  Eyes:     General: Lids are everted, no foreign bodies appreciated. No scleral icterus.       Left eye: No foreign body or hordeolum.     Conjunctiva/sclera: Conjunctivae normal.     Right eye: Right conjunctiva is not injected.     Left eye: Left conjunctiva is not injected.     Pupils: Pupils are equal, round, and reactive to light.  Neck:     Thyroid: No thyromegaly.     Vascular: No JVD.     Trachea: No tracheal deviation.  Cardiovascular:     Rate and Rhythm: Normal rate and regular rhythm.     Heart sounds: Normal heart sounds. No murmur heard.  No friction rub. No gallop.   Pulmonary:     Effort: Pulmonary effort is normal. No respiratory distress.     Breath sounds: Normal breath sounds. No wheezing or rales.  Abdominal:     General: Bowel sounds are normal.     Palpations: Abdomen is soft. There is no hepatomegaly, splenomegaly or mass.     Tenderness: There is no abdominal tenderness. There is no guarding or rebound.  Genitourinary:    Rectum: Normal. Guaiac result negative. No mass or anal fissure.     Comments: Reviewed colonoscopy Musculoskeletal:        General: No tenderness. Normal range of motion.     Cervical back: Normal range of motion and neck supple.  Lymphadenopathy:     Cervical: No cervical adenopathy.  Skin:    General: Skin is warm.     Findings: No rash.  Neurological:     Mental Status: She is alert and oriented to person, place, and time.     Cranial Nerves: No cranial nerve deficit.     Deep Tendon Reflexes: Reflexes normal.  Psychiatric:        Mood and Affect: Mood is not anxious or depressed.     Wt Readings from Last 3 Encounters:  06/10/20 168 lb (76.2 kg)  02/04/20 174 lb (78.9 kg)  12/05/19 173 lb (78.5 kg)    BP 130/68   Pulse 68   Ht 5\' 3"  (1.6 m)   Wt 168 lb (76.2 kg)   BMI 29.76 kg/m   Assessment and Plan:  1. Gastroesophageal  reflux disease .  Controlled.  Stable.  Continue omeprazole 40 mg once a day. - omeprazole (PRILOSEC) 40 MG capsule; One a day  Dispense: 90 capsule; Refill: 1  2. Primary insomnia Chronic.  Controlled.  Stable.  Continue Remeron 15 mg nightly. -  mirtazapine (REMERON) 15 MG tablet; Take 1 tablet (15 mg total) by mouth at bedtime.  Dispense: 90 tablet; Refill: 1  3. Recurrent major depressive disorder, in full remission ([place]) Chronic.  Controlled.  Stable.  PHQ is 0 with a gad score of 2.  Patient will continue sertraline 50 mg once a day. - sertraline (ZOLOFT) 50 MG tablet; One a day  Dispense: 90 tablet; Refill: 1  4. Seasonal allergic rhinitis due to pollen Chronic.  Episodic.  Seasonal.  Patient will continue fluticasone nasal spray as directed. - fluticasone (FLONASE) 50 MCG/ACT nasal spray; 1 SPRAY IN EACH NOSTRIL EVERY DAY  Dispense: 16 g; Refill: 11  5. Slow transit constipation New onset.  Episodic.  On review patient has had her last colonoscopy in 2019 per Dr. Verl Blalock.  Patient is to be redone in 5 years which would be 2024.  Patient's had increased bloating with some irregularity of her bowel movements.  Has been suggested the patient include more fiber in her diet along with fluids and a stool softener has been added to her regimen Colace 50 mg once a day.  If this should continue patient's been instructed to return or bring it to her attention so we can have an referral to gastroenterology. - docusate sodium (COLACE) 50 MG capsule; Take 1 capsule (50 mg total) by mouth daily.  Dispense: 90 capsule; Refill: 1  6. Need for immunization against influenza Discussed and administered - Flu Vaccine QUAD 36+ mos IM

## 2020-06-10 NOTE — Patient Instructions (Signed)

## 2020-08-06 ENCOUNTER — Ambulatory Visit: Payer: BC Managed Care – PPO | Admitting: Family Medicine

## 2020-08-06 ENCOUNTER — Encounter: Payer: Self-pay | Admitting: Family Medicine

## 2020-08-06 ENCOUNTER — Ambulatory Visit
Admission: RE | Admit: 2020-08-06 | Discharge: 2020-08-06 | Disposition: A | Discharge status: Home / Self Care | Payer: BC Managed Care – PPO | Source: Ambulatory Visit | Attending: Family Medicine | Admitting: Family Medicine

## 2020-08-06 ENCOUNTER — Other Ambulatory Visit: Payer: Self-pay

## 2020-08-06 ENCOUNTER — Ambulatory Visit
Admission: RE | Admit: 2020-08-06 | Discharge: 2020-08-06 | Disposition: A | Discharge status: Home / Self Care | Payer: BC Managed Care – PPO | Attending: Family Medicine | Admitting: Family Medicine

## 2020-08-06 VITALS — BP 122/74 | HR 120 | Temp 98.5°F | Ht 63.0 in | Wt 162.0 lb

## 2020-08-06 DIAGNOSIS — R058 Other specified cough: Secondary | ICD-10-CM | POA: Insufficient documentation

## 2020-08-06 DIAGNOSIS — J0100 Acute maxillary sinusitis, unspecified: Secondary | ICD-10-CM | POA: Diagnosis not present

## 2020-08-06 DIAGNOSIS — L570 Actinic keratosis: Secondary | ICD-10-CM

## 2020-08-06 DIAGNOSIS — Z1231 Encounter for screening mammogram for malignant neoplasm of breast: Secondary | ICD-10-CM

## 2020-08-06 MED ORDER — GUAIFENESIN-CODEINE 100-10 MG/5ML PO SYRP: 5.0000 mL | ORAL_SOLUTION | Freq: Four times a day (QID) | ORAL | 0 refills | Status: DC | PRN

## 2020-08-06 MED ORDER — AZITHROMYCIN 250 MG PO TABS: ORAL_TABLET | ORAL | 0 refills | Status: DC

## 2020-08-06 NOTE — Progress Notes (Signed)
Date:  08/06/2020   Name:  Melissa Huerta   DOB:  02-02-1961   MRN:  [id number]   Chief Complaint: Sinusitis (cough, mucous- milky and thick. green production coming from chest)  Sinusitis This is a new problem. The current episode started in the past 7 days. The problem has been gradually worsening since onset. There has been no fever. The pain is mild. Associated symptoms include congestion, coughing, headaches, sinus pressure and sneezing. Pertinent negatives include no chills, diaphoresis, ear pain, hoarse voice, neck pain, shortness of breath, sore throat or swollen glands. Past treatments include oral decongestants. The treatment provided no relief.    Lab Results  Component Value Date   CREATININE 0.78 12/05/2019   BUN 12 12/05/2019   NA 144 12/05/2019   K 4.5 12/05/2019   CL 106 12/05/2019   CO2 22 12/05/2019   Lab Results  Component Value Date   CHOL 215 (H) 12/05/2019   HDL 79 12/05/2019   LDLCALC 122 (H) 12/05/2019   TRIG 80 12/05/2019   CHOLHDL 3.8 05/25/2018   Lab Results  Component Value Date   TSH 1.230 02/15/2019   No results found for: HGBA1C Lab Results  Component Value Date   WBC 7.4 02/15/2019   HGB 14.2 02/15/2019   HCT 42.4 02/15/2019   MCV 95 02/15/2019   PLT 322 02/15/2019   Lab Results  Component Value Date   ALT 38 (H) 02/15/2019   AST 19 02/15/2019   ALKPHOS 128 (H) 02/15/2019   BILITOT <0.2 02/15/2019     Review of Systems  Constitutional: Positive for unexpected weight change. Negative for chills, diaphoresis, fatigue and fever.  HENT: Positive for congestion, sinus pressure and sneezing. Negative for ear discharge, ear pain, hoarse voice, rhinorrhea and sore throat.   Eyes: Negative for photophobia, pain, discharge, redness and itching.  Respiratory: Positive for cough. Negative for shortness of breath, wheezing and stridor.   Gastrointestinal: Negative for abdominal pain, blood in stool, constipation, diarrhea, nausea and  vomiting.  Endocrine: Negative for cold intolerance, heat intolerance, polydipsia, polyphagia and polyuria.  Genitourinary: Negative for dysuria, flank pain, frequency, hematuria, menstrual problem, pelvic pain, urgency, vaginal bleeding and vaginal discharge.  Musculoskeletal: Negative for arthralgias, back pain, myalgias and neck pain.  Skin: Negative for rash.  Allergic/Immunologic: Negative for environmental allergies and food allergies.  Neurological: Positive for headaches. Negative for dizziness, weakness, light-headedness and numbness.  Hematological: Negative for adenopathy. Does not bruise/bleed easily.  Psychiatric/Behavioral: Negative for dysphoric mood. The patient is not nervous/anxious.     Patient Active Problem List   Diagnosis Date Noted  . Primary insomnia 02/15/2019  . Seasonal allergic rhinitis due to pollen 02/15/2019  . Encounter for screening colonoscopy   . Benign neoplasm of ascending colon   . Benign neoplasm of descending colon   . Recurrent major depressive disorder, in full remission ([place]) 11/08/2017  . Gastroesophageal reflux disease 11/08/2017  . Influenza vaccine needed 11/14/2016    Allergies  Allergen Reactions  . Banana Nausea And Vomiting  . Codeine Other (See Comments)    STOMACH ISSUES  . Penicillins Other (See Comments)    LOCAL REACTION, SWELLING  . Percocet [Oxycodone-Acetaminophen] Nausea Only and Other (See Comments)    DARVOCET,DARVON, AND OTHER PAIN MEDICINES    Past Surgical History:  Procedure Laterality Date  . COLONOSCOPY  2007    [facility] docs  . COLONOSCOPY WITH PROPOFOL N/A 07/05/2018   Procedure: COLONOSCOPY WITH BIOPSIES;  Surgeon: Lucilla Lame, MD;  Location: [place];  Service: Endoscopy;  Laterality: N/A;  request early  . GALLBLADDER SURGERY    . GASTRIC BYPASS    . NASAL SINUS SURGERY  2014  . POLYPECTOMY N/A 07/05/2018   Procedure: POLYPECTOMY;  Surgeon: Lucilla Lame, MD;  Location: [place];   Service: Endoscopy;  Laterality: N/A;    Social History   Tobacco Use  . Smoking status: Former Smoker    Packs/day: 0.25    Years: 30.00    Pack years: 7.50    Types: Cigarettes    Quit date: 07/08/2020    Years since quitting: 0.0  . Smokeless tobacco: Never Used  . Tobacco comment: smokes socially,   Vaping Use  . Vaping Use: Never used  Substance Use Topics  . Alcohol use: Yes    Alcohol/week: 6.0 standard drinks    Types: 4 Cans of beer, 2 Shots of liquor per week  . Drug use: No     Medication list has been reviewed and updated.  Current Meds  Medication Sig  . carbamide peroxide (DEBROX) 6.5 % OTIC solution Place 5 drops into both ears 2 (two) times daily.  Marland Kitchen docusate sodium (COLACE) 50 MG capsule Take 1 capsule (50 mg total) by mouth daily.  . fluticasone (FLONASE) 50 MCG/ACT nasal spray 1 SPRAY IN EACH NOSTRIL EVERY DAY  . mirtazapine (REMERON) 15 MG tablet Take 1 tablet (15 mg total) by mouth at bedtime.  . Multiple Vitamins-Minerals (CENTRUM SILVER 50+WOMEN PO) Take by mouth daily. AM  . omeprazole (PRILOSEC) 40 MG capsule One a day  . sertraline (ZOLOFT) 50 MG tablet One a day    PHQ 2/9 Scores 08/06/2020 06/10/2020 02/04/2020 12/05/2019  PHQ - 2 Score 0 0 0 0  PHQ- 9 Score 0 0 4 1    GAD 7 : Generalized Anxiety Score 08/06/2020 06/10/2020 02/04/2020 12/05/2019  Nervous, Anxious, on Edge 0 0 0 1  Control/stop worrying 0 0 0 1  Worry too much - different things 0 0 1 0  Trouble relaxing 0 0 0 1  Restless 0 0 0 0  Easily annoyed or irritable 0 1 0 1  Afraid - awful might happen 0 0 1 0  Total GAD 7 Score 0 1 2 4   Anxiety Difficulty - - Somewhat difficult Not difficult at all    BP Readings from Last 3 Encounters:  08/06/20 122/74  06/10/20 130/68  02/04/20 130/70    Physical Exam Vitals and nursing note reviewed.  Constitutional:      General: She is not in acute distress.    Appearance: She is not diaphoretic.  HENT:     Head: Normocephalic and  atraumatic.     Right Ear: Tympanic membrane, ear canal and external ear normal.     Left Ear: Tympanic membrane, ear canal and external ear normal.     Nose: Nose normal. No congestion or rhinorrhea.  Eyes:     General:        Right eye: No discharge.        Left eye: No discharge.     Conjunctiva/sclera: Conjunctivae normal.     Pupils: Pupils are equal, round, and reactive to light.  Neck:     Thyroid: No thyromegaly.     Vascular: No JVD.  Cardiovascular:     Rate and Rhythm: Normal rate and regular rhythm.     Pulses: Normal pulses.     Heart sounds: Normal heart sounds. No murmur heard.  No friction rub. No gallop.   Pulmonary:     Effort: Pulmonary effort is normal.     Breath sounds: Normal breath sounds. No wheezing or rhonchi.  Chest:     Breasts:        Right: Normal. No swelling, bleeding, inverted nipple, mass, nipple discharge, skin change or tenderness.        Left: No swelling, bleeding, inverted nipple, mass, nipple discharge, skin change or tenderness.  Abdominal:     General: Bowel sounds are normal.     Palpations: Abdomen is soft. There is no mass.     Tenderness: There is no abdominal tenderness. There is no guarding.  Musculoskeletal:        General: Normal range of motion.     Cervical back: Normal range of motion and neck supple.  Lymphadenopathy:     Cervical: No cervical adenopathy.     Upper Body:     Right upper body: No supraclavicular or axillary adenopathy.     Left upper body: No supraclavicular or axillary adenopathy.  Skin:    General: Skin is warm and dry.     Comments: Hyperkeratotic paupular lesion left shoulder.  Neurological:     Mental Status: She is alert.     Deep Tendon Reflexes: Reflexes are normal and symmetric.     Wt Readings from Last 3 Encounters:  08/06/20 162 lb (73.5 kg)  06/10/20 168 lb (76.2 kg)  02/04/20 174 lb (78.9 kg)    BP 122/74   Pulse (!) 120   Temp 98.5 F (36.9 C) (Oral)   Ht 5\' 3"  (1.6 m)   Wt  162 lb (73.5 kg)   SpO2 98%   BMI 28.70 kg/m   Assessment and Plan: 1. Acute non-recurrent maxillary sinusitis Acute.  Persistent.  Relatively stable.  But given the persistent nature of this we will treat with azithromycin to 50 mg 2 today followed by 1 a day for 4 days. - azithromycin (ZITHROMAX) 250 MG tablet; 2 today then 1 a day for 4 days  Dispense: 6 tablet; Refill: 0 - guaiFENesin-codeine (ROBITUSSIN AC) 100-10 MG/5ML syrup; Take 5 mLs by mouth 4 (four) times daily as needed for cough.  Dispense: 118 mL; Refill: 0  2. Cough productive of purulent sputum New onset persistent.  Relatively stable.  Without dyspnea.  Will treat with Robitussin-AC 1 teaspoon every 6 hours for 7 days. - DG Chest 2 View; Future  3. Encounter for screening mammogram for malignant neoplasm of breast Discussed with patient.  Breast exam was normal with no palpable mass today.  Will schedule for mammogram.  4. Actinic keratosis Newly presented to physician today.  There is an area of hyperkeratosis that is papular that is almost wartlike but may be an actinic keratoses.  Will refer to dermatology for evaluation and treatment. - Ambulatory referral to Dermatology  5. Breast cancer screening by mammogram Discussed with patient and mammogram is scheduled. - MM 3D SCREEN BREAST BILATERAL; Future

## 2020-08-10 ENCOUNTER — Telehealth: Payer: Self-pay

## 2020-08-10 NOTE — Telephone Encounter (Signed)
I spoke to pt about normal xray? I don't know if this was before or after my conversation with her

## 2020-08-10 NOTE — Telephone Encounter (Unsigned)
Copied from [place] [id number]. Topic: General - Other >> Aug 10, 2020 10:39 AM Alanda Slim E wrote: Reason for CRM: pt would like a call to discuss xray results/please advise

## 2020-08-20 ENCOUNTER — Telehealth: Payer: Self-pay

## 2020-08-20 NOTE — Telephone Encounter (Signed)
Copied from [place] [phone]. Topic: General - Inquiry >> Aug 20, 2020  8:46 AM Oneta Rack wrote: Reason for CRM: patient employee is requesting PCP to fill out an "Updated COVID Form" deadline is 08/31/2020. Patient would like Baxter Flattery to return her call, patient would like to email form due to her working at home and not having access to a printer.

## 2020-08-24 NOTE — Telephone Encounter (Signed)
Pt wanted a note to continue working virtually at home, for [employer]. I asked what the reason would be because we only see her approx twice a year for a sinus infection and this kind of situation would normally qualify for a person with severe COPD. 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 I explained that we are not filling this out for that reason and she would need to discuss that with that MD.

## 2021-02-25 ENCOUNTER — Other Ambulatory Visit: Payer: Self-pay | Admitting: Family Medicine

## 2021-02-25 DIAGNOSIS — K219 Gastro-esophageal reflux disease without esophagitis: Secondary | ICD-10-CM

## 2021-04-15 ENCOUNTER — Other Ambulatory Visit: Payer: Self-pay | Admitting: Family Medicine

## 2021-04-15 DIAGNOSIS — F5101 Primary insomnia: Secondary | ICD-10-CM

## 2021-04-15 NOTE — Telephone Encounter (Signed)
  Notes to clinic:  Patient has appointment on 04/21/2021 Review for refill   Requested Prescriptions  Pending Prescriptions Disp Refills   mirtazapine (REMERON) 15 MG tablet [Pharmacy Med Name: MIRTAZAPINE 15 MG TAB] 90 tablet 1    Sig: TAKE 1 TABLET BY MOUTH AT BEDTIME.      Psychiatry: Antidepressants - mirtazapine Failed - 04/15/2021  8:56 AM      Failed - AST in normal range and within 360 days    AST  Date Value Ref Range Status  02/15/2019 19 0 - 40 IU/L Final          Failed - ALT in normal range and within 360 days    ALT  Date Value Ref Range Status  02/15/2019 38 (H) 0 - 32 IU/L Final          Failed - Triglycerides in normal range and within 360 days    Triglycerides  Date Value Ref Range Status  12/05/2019 80 0 - 149 mg/dL Final          Failed - Total Cholesterol in normal range and within 360 days    Cholesterol, Total  Date Value Ref Range Status  12/05/2019 215 (H) 100 - 199 mg/dL Final          Failed - WBC in normal range and within 360 days    WBC  Date Value Ref Range Status  02/15/2019 7.4 3.4 - 10.8 x10E3/uL Final          Failed - Valid encounter within last 6 months    Recent Outpatient Visits           8 months ago Acute non-recurrent maxillary sinusitis   [facility] Juline Patch, MD   10 months ago Gastroesophageal reflux disease   [facility] Juline Patch, MD   1 year ago Familial hypercholesterolemia   [facility] Juline Patch, MD   1 year ago Gastroesophageal reflux disease   [facility] Juline Patch, MD   1 year ago Acute non-recurrent maxillary sinusitis   [facility] Juline Patch, MD       Future Appointments             In 6 days Juline Patch, MD [facility] [place] - Completed PHQ-2 or PHQ-9 in the last 360 days

## 2021-04-21 ENCOUNTER — Ambulatory Visit: Payer: BC Managed Care – PPO | Admitting: Family Medicine

## 2021-04-21 ENCOUNTER — Encounter: Payer: Self-pay | Admitting: Family Medicine

## 2021-04-21 ENCOUNTER — Other Ambulatory Visit: Payer: Self-pay

## 2021-04-21 VITALS — BP 100/60 | HR 64 | Temp 98.4°F | Ht 63.0 in | Wt 162.0 lb

## 2021-04-21 DIAGNOSIS — F5101 Primary insomnia: Secondary | ICD-10-CM | POA: Diagnosis not present

## 2021-04-21 DIAGNOSIS — J301 Allergic rhinitis due to pollen: Secondary | ICD-10-CM | POA: Diagnosis not present

## 2021-04-21 DIAGNOSIS — F3342 Major depressive disorder, recurrent, in full remission: Secondary | ICD-10-CM

## 2021-04-21 DIAGNOSIS — R5383 Other fatigue: Secondary | ICD-10-CM

## 2021-04-21 DIAGNOSIS — E860 Dehydration: Secondary | ICD-10-CM

## 2021-04-21 DIAGNOSIS — J0100 Acute maxillary sinusitis, unspecified: Secondary | ICD-10-CM

## 2021-04-21 DIAGNOSIS — K219 Gastro-esophageal reflux disease without esophagitis: Secondary | ICD-10-CM | POA: Diagnosis not present

## 2021-04-21 MED ORDER — MIRTAZAPINE 15 MG PO TABS: 15.0000 mg | ORAL_TABLET | Freq: Every day | ORAL | 3 refills | Status: DC

## 2021-04-21 MED ORDER — AZITHROMYCIN 250 MG PO TABS: ORAL_TABLET | ORAL | 1 refills | Status: AC

## 2021-04-21 MED ORDER — OMEPRAZOLE 40 MG PO CPDR: DELAYED_RELEASE_CAPSULE | ORAL | 3 refills | Status: DC

## 2021-04-21 MED ORDER — FLUTICASONE PROPIONATE 50 MCG/ACT NA SUSP: NASAL | 11 refills | Status: DC

## 2021-04-21 MED ORDER — SERTRALINE HCL 50 MG PO TABS: ORAL_TABLET | ORAL | 3 refills | Status: DC

## 2021-04-21 NOTE — Progress Notes (Signed)
Date:  04/21/2021   Name:  Melissa Huerta   DOB:  [birth year]   MRN:  [id number]   Chief Complaint: Allergic Rhinitis  (Having "attacks" of sneezing), Gastroesophageal Reflux, Depression, Insomnia, and Sinusitis (Ears stopped up L) side, dizziness and sinus drainage- "milky" production)  Gastroesophageal Reflux She complains of belching. She reports no abdominal pain, no chest pain, no choking, no coughing, no dysphagia, no heartburn, no hoarse voice, no nausea or no sore throat. not new. The problem has been gradually improving. The symptoms are aggravated by certain foods (acidic). Associated symptoms include fatigue. Pertinent negatives include no anemia, melena, muscle weakness or orthopnea. She has tried a PPI for the symptoms. The treatment provided moderate relief.  Depression      (not new)  This is a chronic problem.  The current episode started more than 1 year ago.   The problem occurs intermittently.The problem is unchanged.  Associated symptoms include fatigue, insomnia, irritable and headaches.  Associated symptoms include no decreased concentration, no helplessness, no hopelessness, no restlessness, no decreased interest, no appetite change, no body aches, no myalgias, not sad and no suicidal ideas.  Past treatments include SSRIs - Selective serotonin reuptake inhibitors.  Compliance with treatment is variable.  Previous treatment provided moderate relief. Insomnia Primary symptoms: no fragmented sleep, no sleep disturbance, no difficulty falling asleep, no somnolence, no frequent awakening, no malaise/fatigue, no napping.   The problem has been waxing and waning since onset. How many beverages per day that contain caffeine: 0 - 1.  Types of beverages you drink: coffee. The treatment provided moderate relief. PMH includes: depression.   Sinusitis This is a new problem. The current episode started 1 to 4 weeks ago (3 weeks). The problem has been gradually worsening since onset. There  has been no fever. The pain is mild (sinus headache). Associated symptoms include congestion, ear pain, headaches, sinus pressure and sneezing. Pertinent negatives include no chills, coughing, hoarse voice, neck pain, shortness of breath, sore throat or swollen glands.   Lab Results  Component Value Date   CREATININE 0.78 12/05/2019   BUN 12 12/05/2019   NA 144 12/05/2019   K 4.5 12/05/2019   CL 106 12/05/2019   CO2 22 12/05/2019   Lab Results  Component Value Date   CHOL 215 (H) 12/05/2019   HDL 79 12/05/2019   LDLCALC 122 (H) 12/05/2019   TRIG 80 12/05/2019   CHOLHDL 3.8 05/25/2018   Lab Results  Component Value Date   TSH 1.230 02/15/2019   No results found for: HGBA1C Lab Results  Component Value Date   WBC 7.4 02/15/2019   HGB 14.2 02/15/2019   HCT 42.4 02/15/2019   MCV 95 02/15/2019   PLT 322 02/15/2019   Lab Results  Component Value Date   ALT 38 (H) 02/15/2019   AST 19 02/15/2019   ALKPHOS 128 (H) 02/15/2019   BILITOT <0.2 02/15/2019     Review of Systems  Constitutional:  Positive for fatigue. Negative for appetite change, chills, fever and malaise/fatigue.  HENT:  Positive for congestion, ear pain, sinus pressure and sneezing. Negative for drooling, ear discharge, hoarse voice and sore throat.   Respiratory:  Negative for cough, choking and shortness of breath.   Cardiovascular:  Negative for chest pain, palpitations and leg swelling.  Gastrointestinal:  Negative for abdominal pain, blood in stool, constipation, diarrhea, dysphagia, heartburn, melena and nausea.  Endocrine: Negative for polydipsia.  Genitourinary:  Negative for dysuria, frequency, hematuria and urgency.  Musculoskeletal:  Negative for back pain, myalgias, muscle weakness and neck pain.  Skin:  Negative for rash.  Allergic/Immunologic: Negative for environmental allergies.  Neurological:  Positive for headaches. Negative for dizziness.  Hematological:  Does not bruise/bleed easily.   Psychiatric/Behavioral:  Positive for depression. Negative for decreased concentration, sleep disturbance and suicidal ideas. The patient has insomnia. The patient is not nervous/anxious.    Patient Active Problem List   Diagnosis Date Noted   Primary insomnia 02/15/2019   Seasonal allergic rhinitis due to pollen 02/15/2019   Encounter for screening colonoscopy    Benign neoplasm of ascending colon    Benign neoplasm of descending colon    Recurrent major depressive disorder, in full remission ([place]) 11/08/2017   Gastroesophageal reflux disease 11/08/2017   Influenza vaccine needed 11/14/2016    Allergies  Allergen Reactions   Banana Nausea And Vomiting   Codeine Other (See Comments)    STOMACH ISSUES   Penicillins Other (See Comments)    LOCAL REACTION, SWELLING   Percocet [Oxycodone-Acetaminophen] Nausea Only and Other (See Comments)    DARVOCET,DARVON, AND OTHER PAIN MEDICINES    Past Surgical History:  Procedure Laterality Date   COLONOSCOPY  2007    [facility]   COLONOSCOPY WITH PROPOFOL N/A 07/05/2018   Procedure: COLONOSCOPY WITH BIOPSIES;  Surgeon: Lucilla Lame, MD;  Location: [place];  Service: Endoscopy;  Laterality: N/A;  request early   [place]  2014   POLYPECTOMY N/A 07/05/2018   Procedure: POLYPECTOMY;  Surgeon: Lucilla Lame, MD;  Location: [facility];  Service: Endoscopy;  Laterality: N/A;    Social History   Tobacco Use   Smoking status: Former    Packs/day: 0.25    Years: 30.00    Pack years: 7.50    Types: Cigarettes    Quit date: 07/08/2020    Years since quitting: 0.7   Smokeless tobacco: Never   Tobacco comments:    smokes socially,   Vaping Use   Vaping Use: Never used  Substance Use Topics   Alcohol use: Yes    Alcohol/week: 6.0 standard drinks    Types: 4 Cans of beer, 2 Shots of liquor per week   Drug use: No     Medication list has been reviewed and  updated.  Current Meds  Medication Sig   fluticasone (FLONASE) 50 MCG/ACT nasal spray 1 SPRAY IN EACH NOSTRIL EVERY DAY   mirtazapine (REMERON) 15 MG tablet Take 1 tablet (15 mg total) by mouth at bedtime.   Multiple Vitamins-Minerals (CENTRUM SILVER 50+WOMEN PO) Take by mouth daily. AM   omeprazole (PRILOSEC) 40 MG capsule TAKE (1) CAPSULE BY MOUTH EVERY DAY   sertraline (ZOLOFT) 50 MG tablet One a day   [DISCONTINUED] carbamide peroxide (DEBROX) 6.5 % OTIC solution Place 5 drops into both ears 2 (two) times daily.    PHQ 2/9 Scores 04/21/2021 08/06/2020 06/10/2020 02/04/2020  PHQ - 2 Score 0 0 0 0  PHQ- 9 Score 2 0 0 4    GAD 7 : Generalized Anxiety Score 04/21/2021 08/06/2020 06/10/2020 02/04/2020  Nervous, Anxious, on Edge 1 0 0 0  Control/stop worrying 1 0 0 0  Worry too much - different things 1 0 0 1  Trouble relaxing 1 0 0 0  Restless 0 0 0 0  Easily annoyed or irritable 2 0 1 0  Afraid - awful might happen 0 0 0 1  Total GAD 7 Score 6 0 1 2  Anxiety Difficulty Not difficult at all - - Somewhat difficult    BP Readings from Last 3 Encounters:  04/21/21 100/60  08/06/20 122/74  06/10/20 130/68    Physical Exam Vitals and nursing note reviewed.  Constitutional:      General: She is irritable.     Appearance: She is well-developed.  HENT:     Head: Normocephalic.     Right Ear: Tympanic membrane, ear canal and external ear normal.     Left Ear: Tympanic membrane, ear canal and external ear normal.     Nose: Congestion present.     Mouth/Throat:     Lips: Pink.     Mouth: Mucous membranes are dry.  Eyes:     General: Lids are everted, no foreign bodies appreciated. No scleral icterus.       Left eye: No foreign body or hordeolum.     Conjunctiva/sclera: Conjunctivae normal.     Right eye: Right conjunctiva is not injected.     Left eye: Left conjunctiva is not injected.     Pupils: Pupils are equal, round, and reactive to light.  Neck:     Thyroid: No thyromegaly.      Vascular: No JVD.     Trachea: No tracheal deviation.  Cardiovascular:     Rate and Rhythm: Normal rate and regular rhythm.     Heart sounds: Normal heart sounds. No murmur heard.   No friction rub. No gallop.  Pulmonary:     Effort: Pulmonary effort is normal. No respiratory distress.     Breath sounds: Normal breath sounds. No wheezing or rales.  Abdominal:     General: Bowel sounds are normal.     Palpations: Abdomen is soft. There is no mass.     Tenderness: There is no abdominal tenderness. There is no guarding or rebound.  Musculoskeletal:        General: No tenderness. Normal range of motion.     Cervical back: Normal range of motion and neck supple.  Lymphadenopathy:     Cervical: No cervical adenopathy.  Skin:    General: Skin is warm.     Findings: No rash.  Neurological:     Mental Status: She is alert and oriented to person, place, and time.     Cranial Nerves: No cranial nerve deficit.     Deep Tendon Reflexes: Reflexes normal.  Psychiatric:        Mood and Affect: Mood is not anxious or depressed.    Wt Readings from Last 3 Encounters:  04/21/21 162 lb (73.5 kg)  08/06/20 162 lb (73.5 kg)  06/10/20 168 lb (76.2 kg)    BP 100/60   Pulse 64   Temp 98.4 F (36.9 C) (Oral)   Ht '5\' 3"'$  (1.6 m)   Wt 162 lb (73.5 kg)   BMI 28.70 kg/m   Assessment and Plan:  1. Gastroesophageal reflux disease, unspecified whether esophagitis present Chronic.  Controlled.  Stable.  Continue omeprazole 40 mg once a day. - omeprazole (PRILOSEC) 40 MG capsule; TAKE (1) CAPSULE BY MOUTH EVERY DAY  Dispense: 90 capsule; Refill: 3  2. Recurrent major depressive disorder, in full remission ([place]) Chronic.  Controlled.  Stable.  PHQ is 2.  Continue sertraline 50 mg once a day. - sertraline (ZOLOFT) 50 MG tablet; One a day  Dispense: 90 tablet; Refill: 3  3. Primary insomnia Chronic.  Controlled.  Stable.  Patient is having reasonable  results on the Remeron 15 mg nightly and will  continue at the present dosing. - mirtazapine (REMERON) 15 MG tablet; Take 1 tablet (15 mg total) by mouth at bedtime.  Dispense: 90 tablet; Refill: 3  4. Seasonal allergic rhinitis due to pollen Chronic.  Controlled.  Stable.  Patient is responding some to the Flonase nasal spray spray each nostril daily. - fluticasone (FLONASE) 50 MCG/ACT nasal spray; 1 SPRAY IN EACH NOSTRIL EVERY DAY  Dispense: 16 g; Refill: 11  5. Fatigue, unspecified type New onset fatigue with noted decreased blood pressure today but patient is having some dizziness.  The mucous membranes are little drier than usual and this may be contributing but we will check a TSH. - TSH  6. Gastroesophageal reflux disease As noted above - omeprazole (PRILOSEC) 40 MG capsule; TAKE (1) CAPSULE BY MOUTH EVERY DAY  Dispense: 90 capsule; Refill: 3  7. Recurrent major depressive disorder, in full remission ([place]) As noted above - sertraline (ZOLOFT) 50 MG tablet; One a day  Dispense: 90 tablet; Refill: 3  8. Acute maxillary sinusitis, recurrence not specified Chronic.  Persistent.  Stable.  Patient has had sinus pressure with some tenderness over the maxillary sinus particularly on the left side.  We will treat with azithromycin to 50 mg 2 today followed by 1 a day for 4 days. - azithromycin (ZITHROMAX) 250 MG tablet; Take 2 tablets on day 1, then 1 tablet daily on days 2 through 5  Dispense: 6 tablet; Refill: 1  9. Dehydration Patient has a decreased blood pressure but is not tachycardic I am thinking that there is some degree of dehydration given the heat index that we have been having the last several weeks.  We will encourage fluids at this stage but if dizziness and fatigue continues we will recheck at the time of her physical.

## 2021-04-22 LAB — TSH: TSH: 1.44 u[IU]/mL (ref 0.450–4.500)

## 2022-05-15 IMAGING — CR DG CHEST 2V
2 series · 3 of 3 positions shown · non-contrast
Comparison: 08/05/2008.

CLINICAL DATA: Productive cough.  Negative COVID test.

EXAM:
CHEST - 2 VIEW

[Series 1: chest pa · 0.14mm/px · 2 of 2 slices shown]
[im 1/2]
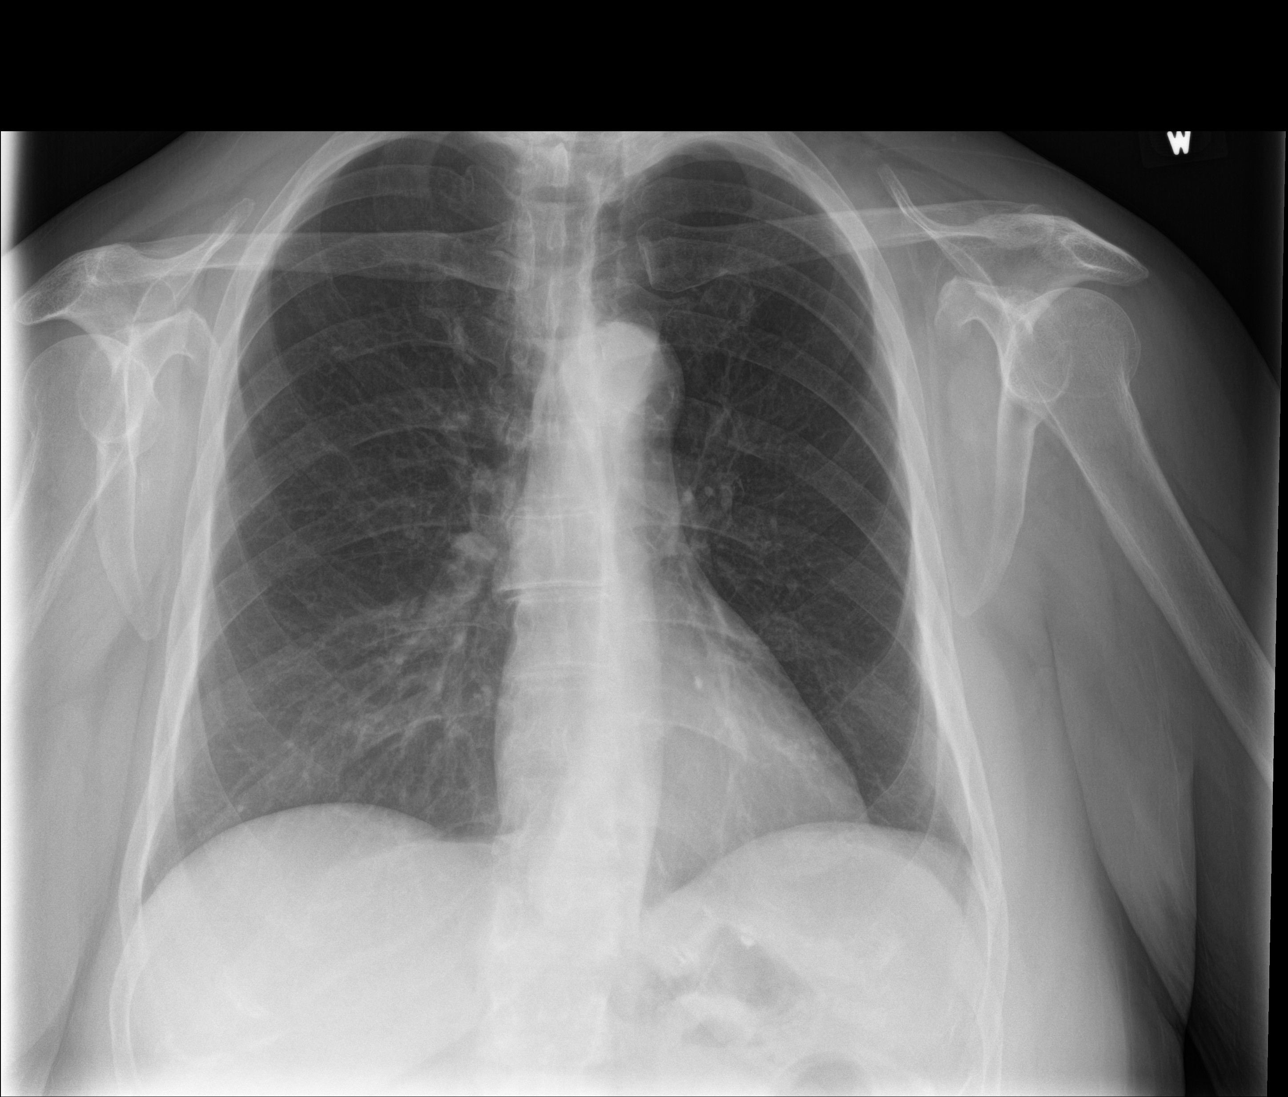
[im 2/2]
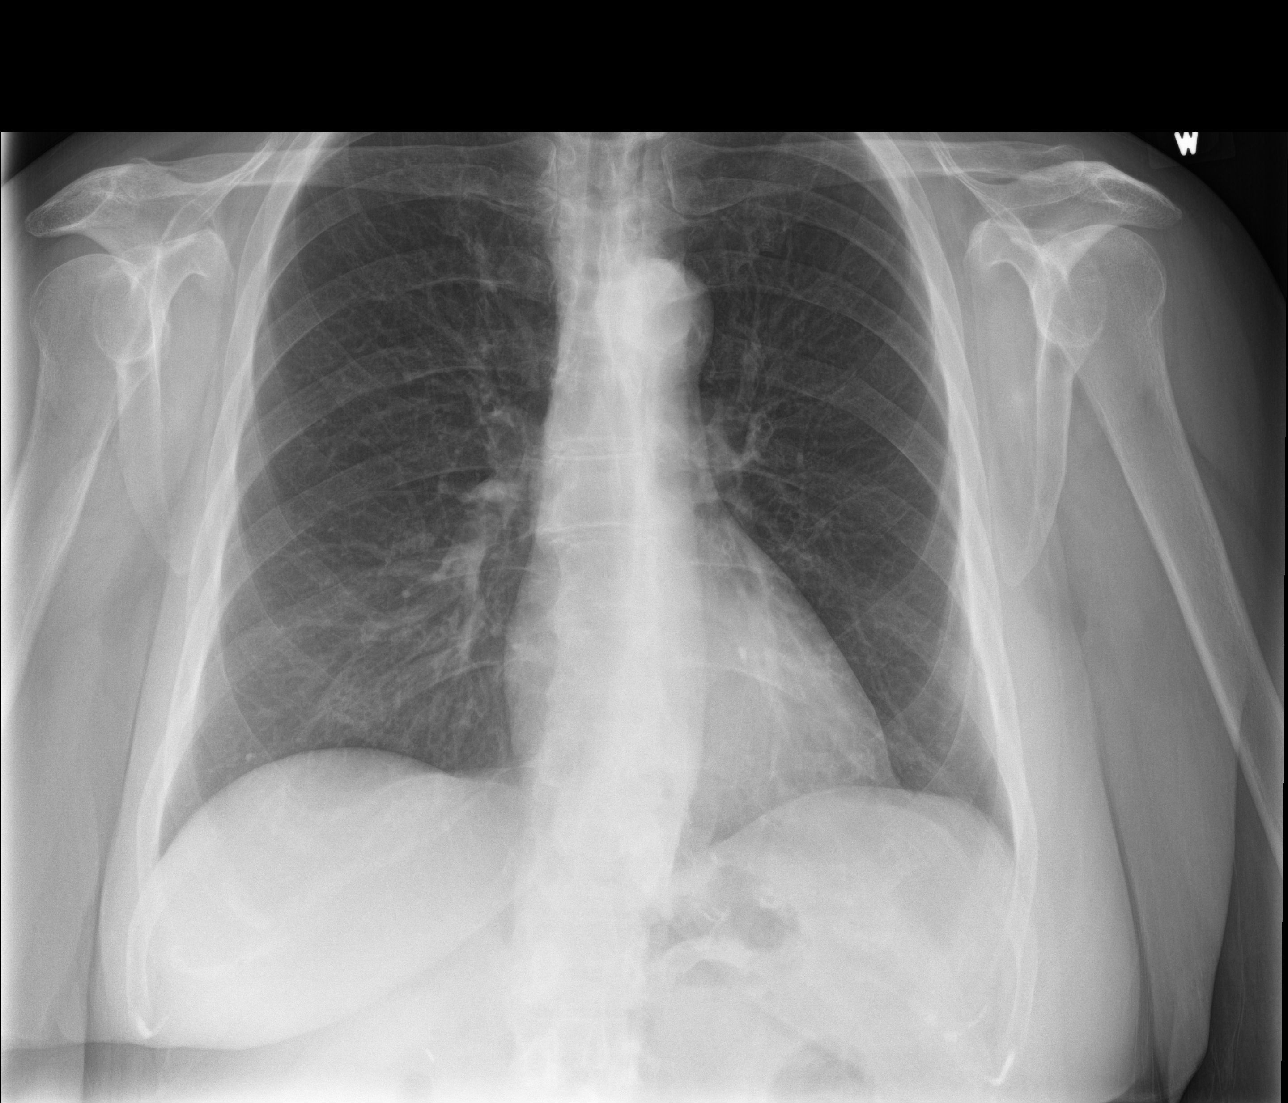

[chest lat]
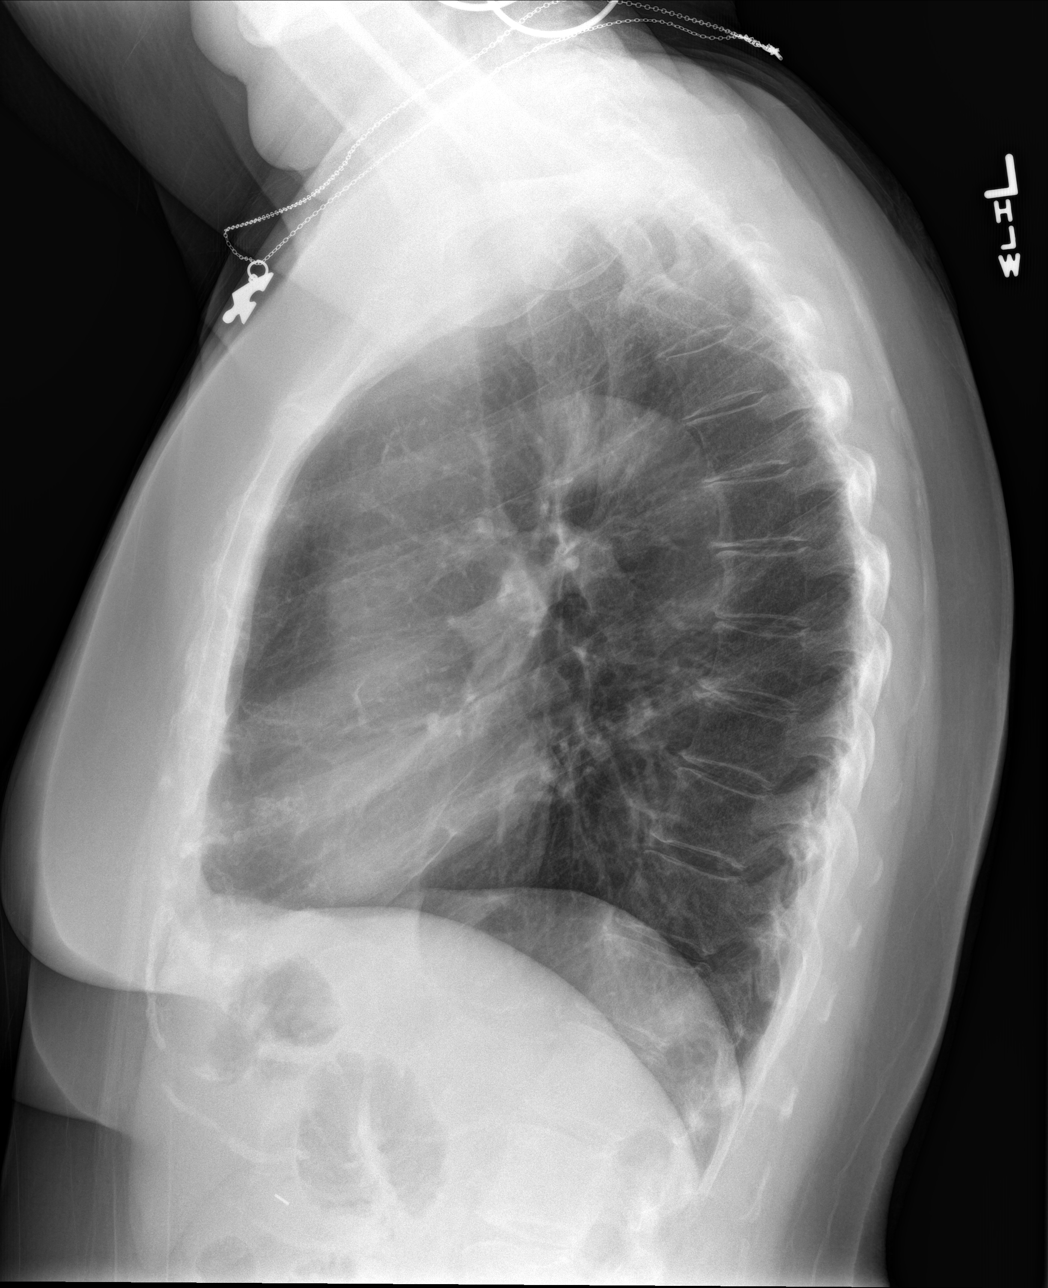

[3 of 3 positions shown; findings below may reference images not displayed]

FINDINGS: Cardiac silhouette is normal in size. Normal mediastinal and hilar
contours.

Lungs are hyperexpanded but clear. No pleural effusion or
pneumothorax.

Skeletal structures are intact.
IMPRESSION: No active cardiopulmonary disease.

## 2022-05-17 ENCOUNTER — Other Ambulatory Visit: Payer: Self-pay | Admitting: Family Medicine

## 2022-05-17 DIAGNOSIS — K219 Gastro-esophageal reflux disease without esophagitis: Secondary | ICD-10-CM

## 2022-07-21 ENCOUNTER — Ambulatory Visit: Payer: BC Managed Care – PPO | Admitting: Family Medicine

## 2022-10-31 ENCOUNTER — Ambulatory Visit: Payer: BC Managed Care – PPO | Admitting: Family Medicine

## 2022-10-31 ENCOUNTER — Encounter: Payer: Self-pay | Admitting: Family Medicine

## 2022-10-31 VITALS — BP 120/78 | HR 100 | Temp 98.3°F | Ht 63.0 in | Wt 166.0 lb

## 2022-10-31 DIAGNOSIS — J0100 Acute maxillary sinusitis, unspecified: Secondary | ICD-10-CM

## 2022-10-31 DIAGNOSIS — R051 Acute cough: Secondary | ICD-10-CM

## 2022-10-31 MED ORDER — AZITHROMYCIN 250 MG PO TABS: ORAL_TABLET | ORAL | 1 refills | Status: AC

## 2022-10-31 MED ORDER — AZELASTINE HCL 0.1 % NA SOLN: 2.0000 | Freq: Two times a day (BID) | NASAL | 12 refills | Status: DC

## 2022-10-31 MED ORDER — BENZONATATE 100 MG PO CAPS: 100.0000 mg | ORAL_CAPSULE | Freq: Three times a day (TID) | ORAL | 0 refills | Status: DC | PRN

## 2022-10-31 NOTE — Progress Notes (Signed)
Date:  10/31/2022   Name:  Melissa Huerta   DOB:  1961/02/27   MRN:  [id number]   Chief Complaint: Sinusitis (Cough and cong off and on since Christmas. Thought she was getting better until the weekend. Drainage, headache, cough with thick, discolored tinge, clear production from nose.)  Sinusitis This is a new problem. The current episode started more than 1 month ago. The problem has been waxing and waning since onset. The maximum temperature recorded prior to her arrival was 100.4 - 100.9 F. The pain is moderate. Associated symptoms include chills, congestion, coughing, diaphoresis, headaches, a hoarse voice, neck pain, shortness of breath, sinus pressure, sneezing and a sore throat. Pertinent negatives include no ear pain or swollen glands. Past treatments include oral decongestants (mucinex).    Lab Results  Component Value Date   NA 144 12/05/2019   K 4.5 12/05/2019   CO2 22 12/05/2019   GLUCOSE 90 12/05/2019   BUN 12 12/05/2019   CREATININE 0.78 12/05/2019   CALCIUM 9.6 12/05/2019   GFRNONAA 83 12/05/2019   Lab Results  Component Value Date   CHOL 215 (H) 12/05/2019   HDL 79 12/05/2019   LDLCALC 122 (H) 12/05/2019   TRIG 80 12/05/2019   CHOLHDL 3.8 05/25/2018   Lab Results  Component Value Date   TSH 1.440 04/21/2021   No results found for: "HGBA1C" Lab Results  Component Value Date   WBC 7.4 02/15/2019   HGB 14.2 02/15/2019   HCT 42.4 02/15/2019   MCV 95 02/15/2019   PLT 322 02/15/2019   Lab Results  Component Value Date   ALT 38 (H) 02/15/2019   AST 19 02/15/2019   ALKPHOS 128 (H) 02/15/2019   BILITOT <0.2 02/15/2019   No results found for: "25OHVITD2", "25OHVITD3", "VD25OH"   Review of Systems  Constitutional:  Positive for chills and diaphoresis. Negative for fever.  HENT:  Positive for congestion, hoarse voice, sinus pressure, sneezing and sore throat. Negative for ear pain.   Respiratory:  Positive for cough and shortness of breath. Negative for  wheezing.   Cardiovascular:  Negative for chest pain, palpitations and leg swelling.  Gastrointestinal:  Negative for abdominal pain, blood in stool, constipation, diarrhea and nausea.  Endocrine: Negative for polydipsia.  Genitourinary:  Negative for dysuria, frequency, hematuria and urgency.  Musculoskeletal:  Positive for neck pain. Negative for back pain and myalgias.  Skin:  Negative for rash.  Allergic/Immunologic: Negative for environmental allergies.  Neurological:  Positive for headaches. Negative for dizziness.  Hematological:  Does not bruise/bleed easily.  Psychiatric/Behavioral:  Negative for suicidal ideas. The patient is not nervous/anxious.     Patient Active Problem List   Diagnosis Date Noted   Primary insomnia 02/15/2019   Seasonal allergic rhinitis due to pollen 02/15/2019   Encounter for screening colonoscopy    Benign neoplasm of ascending colon    Benign neoplasm of descending colon    Recurrent major depressive disorder, in full remission ([place]) 11/08/2017   Gastroesophageal reflux disease 11/08/2017   Influenza vaccine needed 11/14/2016    Allergies  Allergen Reactions   Banana Nausea And Vomiting   Codeine Other (See Comments)    STOMACH ISSUES   Penicillins Other (See Comments)    LOCAL REACTION, SWELLING   Percocet [Oxycodone-Acetaminophen] Nausea Only and Other (See Comments)    DARVOCET,DARVON, AND OTHER PAIN MEDICINES    Past Surgical History:  Procedure Laterality Date   COLONOSCOPY  2007    [facility]  COLONOSCOPY WITH PROPOFOL N/A 07/05/2018   Procedure: COLONOSCOPY WITH BIOPSIES;  Surgeon: Lucilla Lame, MD;  Location: [facility];  Service: Endoscopy;  Laterality: N/A;  request early   [place]  2014   POLYPECTOMY N/A 07/05/2018   Procedure: POLYPECTOMY;  Surgeon: Lucilla Lame, MD;  Location: [facility];  Service: Endoscopy;  Laterality: N/A;    Social History    Tobacco Use   Smoking status: Some Days    Packs/day: 0.25    Years: 30.00    Total pack years: 7.50    Types: Cigarettes    Last attempt to quit: 07/08/2020    Years since quitting: 2.3   Smokeless tobacco: Never   Tobacco comments:    smokes socially,   Vaping Use   Vaping Use: Never used  Substance Use Topics   Alcohol use: Yes    Alcohol/week: 6.0 standard drinks of alcohol    Types: 4 Cans of beer, 2 Shots of liquor per week   Drug use: No     Medication list has been reviewed and updated.  Current Meds  Medication Sig   Multiple Vitamins-Minerals (CENTRUM SILVER 50+WOMEN PO) Take by mouth daily. AM   omeprazole (PRILOSEC) 40 MG capsule TAKE (1) CAPSULE BY MOUTH EVERY DAY   [DISCONTINUED] mirtazapine (REMERON) 15 MG tablet Take 1 tablet (15 mg total) by mouth at bedtime.   [DISCONTINUED] sertraline (ZOLOFT) 50 MG tablet One a day       10/31/2022    4:33 PM 04/21/2021    9:50 AM 08/06/2020   10:55 AM 06/10/2020   10:16 AM  GAD 7 : Generalized Anxiety Score  Nervous, Anxious, on Edge 1 1 0 0  Control/stop worrying 1 1 0 0  Worry too much - different things 0 1 0 0  Trouble relaxing 1 1 0 0  Restless 0 0 0 0  Easily annoyed or irritable 1 2 0 1  Afraid - awful might happen 0 0 0 0  Total GAD 7 Score 4 6 0 1  Anxiety Difficulty Somewhat difficult Not difficult at all         10/31/2022    4:32 PM 04/21/2021    9:49 AM 08/06/2020   10:55 AM  Depression screen PHQ 2/9  Decreased Interest 0 0 0  Down, Depressed, Hopeless 0 0 0  PHQ - 2 Score 0 0 0  Altered sleeping 0 2 0  Tired, decreased energy 0 0 0  Change in appetite 0 0 0  Feeling bad or failure about yourself  0 0 0  Trouble concentrating 0 0 0  Moving slowly or fidgety/restless 0 0 0  Suicidal thoughts 0 0 0  PHQ-9 Score 0 2 0  Difficult doing work/chores Not difficult at all Not difficult at all     BP Readings from Last 3 Encounters:  10/31/22 120/78  04/21/21 100/60  08/06/20 122/74     Physical Exam Vitals and nursing note reviewed.  HENT:     Head: Normocephalic.     Right Ear: Tympanic membrane and ear canal normal. There is no impacted cerumen.     Left Ear: Tympanic membrane and ear canal normal. There is no impacted cerumen.     Nose:     Right Sinus: Maxillary sinus tenderness present. No frontal sinus tenderness.     Left Sinus: Maxillary sinus tenderness present. No frontal sinus tenderness.  Mouth/Throat:     Mouth: Mucous membranes are moist.     Pharynx: No oropharyngeal exudate or posterior oropharyngeal erythema.  Eyes:     Pupils: Pupils are equal, round, and reactive to light.  Lymphadenopathy:     Head:     Right side of head: No submental or submandibular adenopathy.     Left side of head: No submental or submandibular adenopathy.     Cervical:     Right cervical: No superficial, deep or posterior cervical adenopathy.    Left cervical: No superficial, deep or posterior cervical adenopathy.  Neurological:     Mental Status: She is alert.     Wt Readings from Last 3 Encounters:  10/31/22 166 lb (75.3 kg)  04/21/21 162 lb (73.5 kg)  08/06/20 162 lb (73.5 kg)    BP 120/78   Pulse 100   Temp 98.3 F (36.8 C) (Oral)   Ht 5' 3"$  (1.6 m)   Wt 166 lb (75.3 kg)   SpO2 97%   BMI 29.41 kg/m   Assessment and Plan:  1. Acute maxillary sinusitis, recurrence not specified Acute.  Persistent.  Stable.  Remains tenderness over the maxillary sinuses with yellowish discharge.  Patient has exam and history consistent with maxillary sinusitis and will treat with azithromycin to 50 mg 2 tablets today then 1 a day for 4 days.  Will also add Astelin nasal spray to help with persistent rhinorrhea. - azithromycin (ZITHROMAX) 250 MG tablet; Take 2 tablets on day 1, then 1 tablet daily on days 2 through 5  Dispense: 6 tablet; Refill: 1 - azelastine (ASTELIN) 0.1 % nasal spray; Place 2 sprays into both nostrils 2 (two) times daily. Use in each nostril as  directed  Dispense: 30 mL; Refill: 12  2. Acute cough Acute cough which is productive.  Patient has done well on Tessalon Perles in the past 1 3 times a day for cough. - benzonatate (TESSALON PERLES) 100 MG capsule; Take 1 capsule (100 mg total) by mouth 3 (three) times daily as needed for cough.  Dispense: 20 capsule; Refill: 0    Otilio Miu, MD

## 2022-11-25 ENCOUNTER — Ambulatory Visit: Payer: BC Managed Care – PPO | Admitting: Family Medicine

## 2022-11-25 ENCOUNTER — Encounter: Payer: Self-pay | Admitting: Family Medicine

## 2022-11-25 VITALS — BP 122/78 | HR 62 | Ht 63.0 in | Wt 161.0 lb

## 2022-11-25 DIAGNOSIS — E785 Hyperlipidemia, unspecified: Secondary | ICD-10-CM | POA: Diagnosis not present

## 2022-11-25 DIAGNOSIS — K219 Gastro-esophageal reflux disease without esophagitis: Secondary | ICD-10-CM | POA: Diagnosis not present

## 2022-11-25 DIAGNOSIS — F3342 Major depressive disorder, recurrent, in full remission: Secondary | ICD-10-CM

## 2022-11-25 DIAGNOSIS — R69 Illness, unspecified: Secondary | ICD-10-CM

## 2022-11-25 DIAGNOSIS — R5383 Other fatigue: Secondary | ICD-10-CM | POA: Diagnosis not present

## 2022-11-25 DIAGNOSIS — K644 Residual hemorrhoidal skin tags: Secondary | ICD-10-CM

## 2022-11-25 MED ORDER — OMEPRAZOLE 40 MG PO CPDR: DELAYED_RELEASE_CAPSULE | ORAL | 1 refills | Status: DC

## 2022-11-25 MED ORDER — SERTRALINE HCL 50 MG PO TABS: 50.0000 mg | ORAL_TABLET | Freq: Every day | ORAL | 1 refills | Status: DC

## 2022-11-25 MED ORDER — SERTRALINE HCL 50 MG PO TABS: 50.0000 mg | ORAL_TABLET | Freq: Every day | ORAL | 2 refills | Status: DC

## 2022-11-25 NOTE — Progress Notes (Signed)
Date:  11/25/2022   Name:  Melissa Huerta   DOB:  08-22-1961   MRN:  [id number]   Chief Complaint: Gastroesophageal Reflux and Depression (Increase to '50mg'$ - 3 and 4)  Gastroesophageal Reflux She reports no abdominal pain, no chest pain, no coughing, no dysphagia, no heartburn, no nausea, no sore throat or no wheezing. This is a chronic problem. The current episode started more than 1 year ago. The problem has been gradually improving. The symptoms are aggravated by certain foods. Pertinent negatives include no anemia, fatigue or melena. She has tried a PPI for the symptoms. The treatment provided moderate relief.  Depression        This is a chronic problem.  The problem occurs daily.  The problem has been gradually improving since onset.  Associated symptoms include no decreased concentration, no fatigue, no helplessness, no hopelessness, does not have insomnia, not irritable, no restlessness, no decreased interest, no appetite change, no body aches, no myalgias, no headaches, no indigestion, not sad and no suicidal ideas.  Past treatments include SSRIs - Selective serotonin reuptake inhibitors.  Previous treatment provided moderate relief.   Lab Results  Component Value Date   NA 144 12/05/2019   K 4.5 12/05/2019   CO2 22 12/05/2019   GLUCOSE 90 12/05/2019   BUN 12 12/05/2019   CREATININE 0.78 12/05/2019   CALCIUM 9.6 12/05/2019   GFRNONAA 83 12/05/2019   Lab Results  Component Value Date   CHOL 215 (H) 12/05/2019   HDL 79 12/05/2019   LDLCALC 122 (H) 12/05/2019   TRIG 80 12/05/2019   CHOLHDL 3.8 05/25/2018   Lab Results  Component Value Date   TSH 1.440 04/21/2021   No results found for: "HGBA1C" Lab Results  Component Value Date   WBC 7.4 02/15/2019   HGB 14.2 02/15/2019   HCT 42.4 02/15/2019   MCV 95 02/15/2019   PLT 322 02/15/2019   Lab Results  Component Value Date   ALT 38 (H) 02/15/2019   AST 19 02/15/2019   ALKPHOS 128 (H) 02/15/2019   BILITOT <0.2  02/15/2019   No results found for: "25OHVITD2", "25OHVITD3", "VD25OH"   Review of Systems  Constitutional:  Negative for appetite change, chills, fatigue and fever.  HENT:  Negative for drooling, ear discharge, ear pain and sore throat.   Respiratory:  Negative for cough, shortness of breath and wheezing.   Cardiovascular:  Negative for chest pain, palpitations and leg swelling.  Gastrointestinal:  Negative for abdominal pain, blood in stool, constipation, diarrhea, dysphagia, heartburn, melena and nausea.  Endocrine: Negative for polydipsia.  Genitourinary:  Negative for dysuria, frequency, hematuria and urgency.  Musculoskeletal:  Negative for back pain, myalgias and neck pain.  Skin:  Negative for rash.  Allergic/Immunologic: Negative for environmental allergies.  Neurological:  Negative for dizziness and headaches.  Hematological:  Does not bruise/bleed easily.  Psychiatric/Behavioral:  Positive for depression. Negative for decreased concentration and suicidal ideas. The patient is not nervous/anxious and does not have insomnia.     Patient Active Problem List   Diagnosis Date Noted   Primary insomnia 02/15/2019   Seasonal allergic rhinitis due to pollen 02/15/2019   Encounter for screening colonoscopy    Benign neoplasm of ascending colon    Benign neoplasm of descending colon    Recurrent major depressive disorder, in full remission ([place]) 11/08/2017   Gastroesophageal reflux disease 11/08/2017   Influenza vaccine needed 11/14/2016    Allergies  Allergen Reactions   Banana Nausea And  Vomiting   Codeine Other (See Comments)    STOMACH ISSUES   Penicillins Other (See Comments)    LOCAL REACTION, SWELLING   Percocet [Oxycodone-Acetaminophen] Nausea Only and Other (See Comments)    DARVOCET,DARVON, AND OTHER PAIN MEDICINES    Past Surgical History:  Procedure Laterality Date   COLONOSCOPY  2007    [facility]   COLONOSCOPY WITH PROPOFOL N/A 07/05/2018   Procedure:  COLONOSCOPY WITH BIOPSIES;  Surgeon: Lucilla Lame, MD;  Location: [facility];  Service: Endoscopy;  Laterality: N/A;  request early   [place]  2014   POLYPECTOMY N/A 07/05/2018   Procedure: POLYPECTOMY;  Surgeon: Lucilla Lame, MD;  Location: [facility];  Service: Endoscopy;  Laterality: N/A;    Social History   Tobacco Use   Smoking status: Some Days    Packs/day: 0.25    Years: 30.00    Total pack years: 7.50    Types: Cigarettes    Last attempt to quit: 07/08/2020    Years since quitting: 2.3   Smokeless tobacco: Never   Tobacco comments:    smokes socially,   Vaping Use   Vaping Use: Never used  Substance Use Topics   Alcohol use: Yes    Alcohol/week: 6.0 standard drinks of alcohol    Types: 4 Cans of beer, 2 Shots of liquor per week   Drug use: No     Medication list has been reviewed and updated.  Current Meds  Medication Sig   benzonatate (TESSALON PERLES) 100 MG capsule Take 1 capsule (100 mg total) by mouth 3 (three) times daily as needed for cough.   Multiple Vitamins-Minerals (CENTRUM SILVER 50+WOMEN PO) Take by mouth daily. AM   omeprazole (PRILOSEC) 40 MG capsule TAKE (1) CAPSULE BY MOUTH EVERY DAY   [DISCONTINUED] sertraline (ZOLOFT) 25 MG tablet Take 25 mg by mouth daily.       11/25/2022    8:31 AM 10/31/2022    4:33 PM 04/21/2021    9:50 AM 08/06/2020   10:55 AM  GAD 7 : Generalized Anxiety Score  Nervous, Anxious, on Edge '1 1 1 '$ 0  Control/stop worrying 0 1 1 0  Worry too much - different things 0 0 1 0  Trouble relaxing 0 1 1 0  Restless 0 0 0 0  Easily annoyed or irritable '2 1 2 '$ 0  Afraid - awful might happen 1 0 0 0  Total GAD 7 Score '4 4 6 '$ 0  Anxiety Difficulty Somewhat difficult Somewhat difficult Not difficult at all        11/25/2022    8:28 AM 10/31/2022    4:32 PM 04/21/2021    9:49 AM  Depression screen PHQ 2/9  Decreased Interest 0 0 0  Down, Depressed, Hopeless  0 0 0  PHQ - 2 Score 0 0 0  Altered sleeping 1 0 2  Tired, decreased energy 2 0 0  Change in appetite 0 0 0  Feeling bad or failure about yourself  0 0 0  Trouble concentrating 0 0 0  Moving slowly or fidgety/restless 0 0 0  Suicidal thoughts 0 0 0  PHQ-9 Score 3 0 2  Difficult doing work/chores Not difficult at all Not difficult at all Not difficult at all    BP Readings from Last 3 Encounters:  11/25/22 122/78  10/31/22 120/78  04/21/21 100/60    Physical Exam Vitals and nursing  note reviewed.  Constitutional:      General: She is not irritable.    Appearance: She is well-developed.  HENT:     Head: Normocephalic.     Right Ear: Tympanic membrane and external ear normal.     Left Ear: Tympanic membrane and external ear normal.     Nose: No rhinorrhea.     Mouth/Throat:     Mouth: Mucous membranes are moist.  Eyes:     General: Lids are everted, no foreign bodies appreciated. No scleral icterus.       Left eye: No foreign body or hordeolum.     Conjunctiva/sclera:     Right eye: Right conjunctiva is not injected.     Left eye: Left conjunctiva is not injected.  Neck:     Thyroid: No thyromegaly.     Vascular: No JVD.     Trachea: No tracheal deviation.  Cardiovascular:     Rate and Rhythm: Normal rate and regular rhythm.     Heart sounds: Normal heart sounds. No murmur heard.    No friction rub. No gallop.  Pulmonary:     Effort: Pulmonary effort is normal. No respiratory distress.     Breath sounds: Normal breath sounds. No wheezing, rhonchi or rales.  Abdominal:     Palpations: Abdomen is soft. There is no hepatomegaly, splenomegaly or mass.     Tenderness: There is no abdominal tenderness. There is no guarding or rebound.  Musculoskeletal:        General: No tenderness.     Cervical back: Neck supple.  Skin:    General: Skin is warm.     Findings: No rash.  Neurological:     Mental Status: She is alert and oriented to person, place, and time.     Cranial  Nerves: No cranial nerve deficit.     Deep Tendon Reflexes: Reflexes normal.  Psychiatric:        Mood and Affect: Mood is not anxious or depressed.     Wt Readings from Last 3 Encounters:  11/25/22 161 lb (73 kg)  10/31/22 166 lb (75.3 kg)  04/21/21 162 lb (73.5 kg)    BP 122/78   Pulse 62   Ht '5\' 3"'$  (1.6 m)   Wt 161 lb (73 kg)   SpO2 96%   BMI 28.52 kg/m   Assessment and Plan: . 1. Recurrent major depressive disorder, in full remission ([place]) Chronic.  Controlled.  Stable.  PHQ is 3.  GAD score is 2.  Patient has tolerating sertraline 25 mg well and would like to increase dosing to 50 and we will proceed to 50 mg dosing once a day.  Will recheck patient in 6 months or sooner if necessary. - Comprehensive Metabolic Panel (CMET) - CBC w/Diff/Platelet - sertraline (ZOLOFT) 50 MG tablet; Take 1 tablet (50 mg total) by mouth daily.  Dispense: 90 tablet; Refill: 1  2. Gastroesophageal reflux disease Chronic.  Controlled.  Stable.  Continue omeprazole but will increase to 40 mg once a day. - omeprazole (PRILOSEC) 40 MG capsule; TAKE (1) CAPSULE BY MOUTH EVERY DAY  Dispense: 90 capsule; Refill: 1  3. Dyslipidemia Chronic.  Controlled.  Stable.  Continue dietary control of cholesterol and will check lipid panel for current status of LDL. - Lipid Panel With LDL/HDL Ratio  4. Fatigue, unspecified type Vaginal fatigue improving with control of her depression patient has had weight loss surgery and we will check CBC to rule out anemia. - Comprehensive Metabolic  Panel (CMET) - CBC w/Diff/Platelet  5. External hemorrhoids Patient has occasional bright red blood per rectum when she wipes and feels hemorrhoids.  We will recheck if this continues and will check a CBC to rule out any decrease in hemoglobin hematocrit.  6. Taking medication for chronic disease Patient is taking medications that we will follow-up with labs as necessary.   Otilio Miu, MD

## 2022-11-26 LAB — COMPREHENSIVE METABOLIC PANEL
ALT: 24 IU/L (ref 0–32)
AST: 17 IU/L (ref 0–40)
Albumin/Globulin Ratio: 1.8 (ref 1.2–2.2)
Albumin: 4.2 g/dL (ref 3.9–4.9)
Alkaline Phosphatase: 130 IU/L — ABNORMAL HIGH (ref 44–121)
BUN/Creatinine Ratio: 16 (ref 12–28)
BUN: 14 mg/dL (ref 8–27)
Bilirubin Total: <0.2 mg/dL (ref 0.0–1.2)
CO2: 24 mmol/L (ref 20–29)
Calcium: 9.5 mg/dL (ref 8.7–10.3)
Chloride: 106 mmol/L (ref 96–106)
Creatinine, Ser: 0.87 mg/dL (ref 0.57–1.00)
Globulin, Total: 2.4 g/dL (ref 1.5–4.5)
Glucose: 87 mg/dL (ref 70–99)
Potassium: 4.4 mmol/L (ref 3.5–5.2)
Sodium: 145 mmol/L — ABNORMAL HIGH (ref 134–144)
Total Protein: 6.6 g/dL (ref 6.0–8.5)
eGFR: 75 mL/min/{1.73_m2} (ref >59)

## 2022-11-26 LAB — CBC WITH DIFFERENTIAL/PLATELET
Basophils Absolute: 0 10*3/uL (ref 0.0–0.2)
Basos: 1 %
EOS (ABSOLUTE): 0.2 10*3/uL (ref 0.0–0.4)
Eos: 6 %
Hematocrit: 39.2 % (ref 34.0–46.6)
Hemoglobin: 13.5 g/dL (ref 11.1–15.9)
Immature Grans (Abs): 0 10*3/uL (ref 0.0–0.1)
Immature Granulocytes: 0 %
Lymphocytes Absolute: 1.6 10*3/uL (ref 0.7–3.1)
Lymphs: 39 %
MCH: 31.9 pg (ref 26.6–33.0)
MCHC: 34.4 g/dL (ref 31.5–35.7)
MCV: 93 fL (ref 79–97)
Monocytes Absolute: 0.4 10*3/uL (ref 0.1–0.9)
Monocytes: 9 %
Neutrophils Absolute: 1.9 10*3/uL (ref 1.4–7.0)
Neutrophils: 45 %
Platelets: 290 10*3/uL (ref 150–450)
RBC: 4.23 x10E6/uL (ref 3.77–5.28)
RDW: 12.2 % (ref 11.7–15.4)
WBC: 4 10*3/uL (ref 3.4–10.8)

## 2022-11-26 LAB — LIPID PANEL WITH LDL/HDL RATIO
Cholesterol, Total: 217 mg/dL — ABNORMAL HIGH (ref 100–199)
HDL: 76 mg/dL (ref >39)
LDL Chol Calc (NIH): 129 mg/dL — ABNORMAL HIGH (ref 0–99)
LDL/HDL Ratio: 1.7 ratio (ref 0.0–3.2)
Triglycerides: 68 mg/dL (ref 0–149)
VLDL Cholesterol Cal: 12 mg/dL (ref 5–40)

## 2022-11-28 ENCOUNTER — Other Ambulatory Visit: Payer: Self-pay

## 2022-11-28 DIAGNOSIS — E785 Hyperlipidemia, unspecified: Secondary | ICD-10-CM

## 2022-11-28 MED ORDER — ATORVASTATIN CALCIUM 10 MG PO TABS: 10.0000 mg | ORAL_TABLET | Freq: Every day | ORAL | 1 refills | Status: DC

## 2023-06-06 ENCOUNTER — Ambulatory Visit: Payer: BC Managed Care – PPO | Admitting: Family Medicine

## 2023-06-20 ENCOUNTER — Telehealth: Payer: Self-pay

## 2023-06-20 ENCOUNTER — Other Ambulatory Visit: Payer: Self-pay | Admitting: Family Medicine

## 2023-06-20 DIAGNOSIS — F3342 Major depressive disorder, recurrent, in full remission: Secondary | ICD-10-CM

## 2023-06-20 DIAGNOSIS — E785 Hyperlipidemia, unspecified: Secondary | ICD-10-CM

## 2023-06-20 DIAGNOSIS — K219 Gastro-esophageal reflux disease without esophagitis: Secondary | ICD-10-CM

## 2023-06-20 NOTE — Telephone Encounter (Signed)
Patient needs an appointment for med refills per Dr Yetta Barre. Please call patient to schedule for next week.  Thanks!  - [employer]

## 2023-06-20 NOTE — Telephone Encounter (Signed)
Requested  by interface surescripts. Courtesy refill- zoloft .  Requested Prescriptions  Pending Prescriptions Disp Refills   sertraline (ZOLOFT) 50 MG tablet [Pharmacy Med Name: SERTRALINE TAB 50MG ] 90 tablet 0    Sig: TAKE (1) TABLET BY MOUTH EVERY DAY     Psychiatry:  Antidepressants - SSRI - sertraline Failed - 06/20/2023 10:00 AM      Failed - Valid encounter within last 6 months    Recent Outpatient Visits           6 months ago Recurrent major depressive disorder, in full remission (HCC)   [facility]   7 months ago Acute maxillary sinusitis, recurrence not specified   [facility]   2 years ago Gastroesophageal reflux disease, unspecified whether esophagitis present   [facility]   2 years ago Acute non-recurrent maxillary sinusitis   [place] [facility], MD   3 years ago Gastroesophageal reflux disease   [place] [facility], MD              Passed - AST in normal range and within 360 days    AST  Date Value Ref Range Status  11/25/2022 17 0 - 40 IU/L Final         Passed - ALT in normal range and within 360 days    ALT  Date Value Ref Range Status  11/25/2022 24 0 - 32 IU/L Final         Passed - Completed PHQ-2 or PHQ-9 in the last 360 days       omeprazole (PRILOSEC) 40 MG capsule [Pharmacy Med Name: OMEPRAZOLE CAP 40MG ] 90 capsule 1    Sig: TAKE (1) CAPSULE BY MOUTH EVERY DAY     Gastroenterology: Proton Pump Inhibitors Passed - 06/20/2023 10:00 AM      Passed - Valid encounter within last 12 months    Recent Outpatient Visits           6 months ago Recurrent major depressive disorder, in full remission (HCC)   [place] [facility]   7 months ago Acute maxillary sinusitis, recurrence not specified   [place] [facility]   2 years ago Gastroesophageal reflux disease, unspecified whether esophagitis present   [facility]   2 years ago Acute non-recurrent maxillary sinusitis   [place] [facility], MD   3 years ago Gastroesophageal reflux disease   [place] [facility], MD               atorvastatin (LIPITOR) 10 MG tablet [Pharmacy Med Name: ATORVASTATIN TAB 10MG ] 30 tablet 1    Sig: TAKE (1) TABLET BY MOUTH EVERY DAY     Cardiovascular:  Antilipid - Statins Failed - 06/20/2023 10:00 AM      Failed - Lipid Panel in normal range within the last 12 months    Cholesterol, Total  Date Value Ref  Range Status  11/25/2022 217 (H) 100 - 199 mg/dL Final   LDL Chol Calc (NIH)  Date Value Ref Range Status  11/25/2022 129 (H) 0 - 99 mg/dL Final   HDL  Date Value Ref Range Status  11/25/2022 76 >39 mg/dL Final   Triglycerides  Date Value Ref Range Status  11/25/2022 68 0 - 149 mg/dL Final         Passed - Patient is not pregnant      Passed - Valid encounter within last 12 months    Recent Outpatient Visits           6 months ago Recurrent major depressive disorder, in full remission (HCC)   [place] [facility]   7 months ago Acute maxillary sinusitis, recurrence not specified   [place] [facility] MD   2 years ago Gastroesophageal reflux disease, unspecified whether esophagitis present   [facility] MD   2 years  ago Acute non-recurrent maxillary sinusitis   [place] [facility], MD   3 years ago Gastroesophageal reflux disease   [facility], [facility]

## 2023-10-23 ENCOUNTER — Encounter: Payer: Self-pay | Admitting: Family Medicine

## 2023-10-23 ENCOUNTER — Ambulatory Visit: Payer: 59 | Admitting: Family Medicine

## 2023-10-23 ENCOUNTER — Other Ambulatory Visit (HOSPITAL_COMMUNITY)
Admission: RE | Admit: 2023-10-23 | Discharge: 2023-10-23 | Disposition: A | Discharge status: Home / Self Care | Payer: 59 | Source: Ambulatory Visit | Attending: Family Medicine | Admitting: Family Medicine

## 2023-10-23 VITALS — BP 124/76 | HR 82 | Ht 63.0 in | Wt 158.4 lb

## 2023-10-23 DIAGNOSIS — Z124 Encounter for screening for malignant neoplasm of cervix: Secondary | ICD-10-CM | POA: Diagnosis present

## 2023-10-23 DIAGNOSIS — Z0000 Encounter for general adult medical examination without abnormal findings: Secondary | ICD-10-CM

## 2023-10-23 DIAGNOSIS — E785 Hyperlipidemia, unspecified: Secondary | ICD-10-CM

## 2023-10-23 DIAGNOSIS — Z1231 Encounter for screening mammogram for malignant neoplasm of breast: Secondary | ICD-10-CM

## 2023-10-23 DIAGNOSIS — Z1211 Encounter for screening for malignant neoplasm of colon: Secondary | ICD-10-CM

## 2023-10-23 DIAGNOSIS — K219 Gastro-esophageal reflux disease without esophagitis: Secondary | ICD-10-CM | POA: Diagnosis not present

## 2023-10-23 DIAGNOSIS — F3342 Major depressive disorder, recurrent, in full remission: Secondary | ICD-10-CM

## 2023-10-23 MED ORDER — SERTRALINE HCL 50 MG PO TABS: 50.0000 mg | ORAL_TABLET | Freq: Every day | ORAL | 1 refills | Status: DC

## 2023-10-23 MED ORDER — ATORVASTATIN CALCIUM 10 MG PO TABS: 10.0000 mg | ORAL_TABLET | Freq: Every day | ORAL | 1 refills | Status: DC

## 2023-10-23 MED ORDER — OMEPRAZOLE 40 MG PO CPDR: DELAYED_RELEASE_CAPSULE | ORAL | 1 refills | Status: DC

## 2023-10-23 NOTE — Progress Notes (Signed)
Date:  10/23/2023   Name:  Melissa Huerta   DOB:  09-23-1960   MRN:  [id number]   Chief Complaint: Annual Exam  Patient is a 63 year old female who presents for a comprehensive physical exam with PAP and pelvic. The patient reports the following problems: none. Health maintenance has been reviewed up-to-date      Lab Results  Component Value Date   NA 145 (H) 11/25/2022   K 4.4 11/25/2022   CO2 24 11/25/2022   GLUCOSE 87 11/25/2022   BUN 14 11/25/2022   CREATININE 0.87 11/25/2022   CALCIUM 9.5 11/25/2022   EGFR 75 11/25/2022   GFRNONAA 83 12/05/2019   Lab Results  Component Value Date   CHOL 217 (H) 11/25/2022   HDL 76 11/25/2022   LDLCALC 129 (H) 11/25/2022   TRIG 68 11/25/2022   CHOLHDL 3.8 05/25/2018   Lab Results  Component Value Date   TSH 1.440 04/21/2021   No results found for: "HGBA1C" Lab Results  Component Value Date   WBC 4.0 11/25/2022   HGB 13.5 11/25/2022   HCT 39.2 11/25/2022   MCV 93 11/25/2022   PLT 290 11/25/2022   Lab Results  Component Value Date   ALT 24 11/25/2022   AST 17 11/25/2022   ALKPHOS 130 (H) 11/25/2022   BILITOT <0.2 11/25/2022   No results found for: "25OHVITD2", "25OHVITD3", "VD25OH"   Review of Systems  Constitutional: Negative.  Negative for chills, fatigue, fever and unexpected weight change.  HENT:  Negative for congestion, drooling, ear discharge, ear pain, rhinorrhea, sinus pressure, sneezing and sore throat.   Respiratory:  Negative for cough, shortness of breath, wheezing and stridor.   Cardiovascular:  Negative for chest pain, palpitations and leg swelling.  Gastrointestinal:  Negative for abdominal pain, blood in stool, constipation, diarrhea and nausea.  Endocrine: Negative for polydipsia.  Genitourinary:  Negative for dysuria, flank pain, frequency, hematuria, urgency and vaginal discharge.  Musculoskeletal:  Negative for arthralgias, back pain, myalgias and neck pain.  Skin:  Negative for rash.   Allergic/Immunologic: Negative for environmental allergies.  Neurological:  Negative for dizziness, weakness and headaches.  Hematological:  Negative for adenopathy. Does not bruise/bleed easily.  Psychiatric/Behavioral:  Negative for dysphoric mood and suicidal ideas. The patient is not nervous/anxious.     Patient Active Problem List   Diagnosis Date Noted   Primary insomnia 02/15/2019   Seasonal allergic rhinitis due to pollen 02/15/2019   Encounter for screening colonoscopy    Benign neoplasm of ascending colon    Benign neoplasm of descending colon    Recurrent major depressive disorder, in full remission (HCC) 11/08/2017   Gastroesophageal reflux disease 11/08/2017   Influenza vaccine needed 11/14/2016    Allergies  Allergen Reactions   Banana Nausea And Vomiting   Codeine Other (See Comments)    STOMACH ISSUES   Penicillins Other (See Comments)    LOCAL REACTION, SWELLING   Percocet [Oxycodone-Acetaminophen] Nausea Only and Other (See Comments)    DARVOCET,DARVON, AND OTHER PAIN MEDICINES    Past Surgical History:  Procedure Laterality Date   COLONOSCOPY  2007    [facility]   COLONOSCOPY WITH PROPOFOL N/A 07/05/2018   Procedure: COLONOSCOPY WITH BIOPSIES;  Surgeon: Midge Minium, MD;  [facility];  Service: Endoscopy;  Laterality: N/A;  request early   GALLBLADDER SURGERY     GASTRIC BYPASS     NASAL SINUS SURGERY  2014   POLYPECTOMY N/A 07/05/2018   Procedure:  POLYPECTOMY;  Surgeon: Midge Minium, MD;  Location[facility];  Service: Endoscopy;  Laterality: N/A;    Social History   Tobacco Use   Smoking status: Some Days    Current packs/day: 0.00    Average packs/day: 0.3 packs/day for 30.0 years (7.5 ttl pk-yrs)    Types: Cigarettes    Start date: 07/08/1990    Last attempt to quit: 07/08/2020    Years since quitting: 3.2   Smokeless tobacco: Never   Tobacco comments:    smokes socially,   Vaping Use   Vaping status: Never Used   Substance Use Topics   Alcohol use: Yes    Alcohol/week: 6.0 standard drinks of alcohol    Types: 4 Cans of beer, 2 Shots of liquor per week   Drug use: No     Medication list has been reviewed and updated.  Current Meds  Medication Sig   atorvastatin (LIPITOR) 10 MG tablet TAKE (1) TABLET BY MOUTH EVERY DAY   azelastine (ASTELIN) 0.1 % nasal spray Place 2 sprays into both nostrils 2 (two) times daily. Use in each nostril as directed   fluticasone (FLONASE) 50 MCG/ACT nasal spray 1 SPRAY IN EACH NOSTRIL EVERY DAY   Multiple Vitamins-Minerals (CENTRUM SILVER 50+WOMEN PO) Take by mouth daily. AM   omeprazole (PRILOSEC) 40 MG capsule TAKE (1) CAPSULE BY MOUTH EVERY DAY   sertraline (ZOLOFT) 50 MG tablet TAKE (1) TABLET BY MOUTH EVERY DAY       10/23/2023    9:15 AM 11/25/2022    8:31 AM 10/31/2022    4:33 PM 04/21/2021    9:50 AM  GAD 7 : Generalized Anxiety Score  Nervous, Anxious, on Edge 0 1 1 1   Control/stop worrying 1 0 1 1  Worry too much - different things 1 0 0 1  Trouble relaxing 1 0 1 1  Restless 0 0 0 0  Easily annoyed or irritable 1 2 1 2   Afraid - awful might happen 1 1 0 0  Total GAD 7 Score 5 4 4 6   Anxiety Difficulty Not difficult at all Somewhat difficult Somewhat difficult Not difficult at all       10/23/2023    9:14 AM 11/25/2022    8:28 AM 10/31/2022    4:32 PM  Depression screen PHQ 2/9  Decreased Interest 0 0 0  Down, Depressed, Hopeless 0 0 0  PHQ - 2 Score 0 0 0  Altered sleeping 1 1 0  Tired, decreased energy 1 2 0  Change in appetite 0 0 0  Feeling bad or failure about yourself  0 0 0  Trouble concentrating 1 0 0  Moving slowly or fidgety/restless 0 0 0  Suicidal thoughts 0 0 0  PHQ-9 Score 3 3 0  Difficult doing work/chores Not difficult at all Not difficult at all Not difficult at all    BP Readings from Last 3 Encounters:  10/23/23 124/76  11/25/22 122/78  10/31/22 120/78    Physical Exam Vitals and nursing note reviewed.   Constitutional:      General: She is not in acute distress.    Appearance: She is not diaphoretic.  HENT:     Head: Normocephalic and atraumatic.     Right Ear: Tympanic membrane, ear canal and external ear normal.     Left Ear: Tympanic membrane, ear canal and external ear normal.     Nose: Nose normal.  Eyes:     General:  Right eye: No discharge.        Left eye: No discharge.     Conjunctiva/sclera: Conjunctivae normal.     Pupils: Pupils are equal, round, and reactive to light.  Neck:     Thyroid: No thyromegaly.     Vascular: No JVD.  Cardiovascular:     Rate and Rhythm: Normal rate and regular rhythm.     Heart sounds: Normal heart sounds. No murmur heard.    No friction rub. No gallop.  Pulmonary:     Effort: Pulmonary effort is normal.     Breath sounds: Normal breath sounds. No wheezing, rhonchi or rales.  Chest:  Breasts:    Right: Normal. No swelling, bleeding, inverted nipple, mass, nipple discharge, skin change or tenderness.     Left: Normal. No swelling, bleeding, inverted nipple, mass, nipple discharge, skin change or tenderness.  Abdominal:     General: Bowel sounds are normal.     Palpations: Abdomen is soft. There is no hepatomegaly, splenomegaly or mass.     Tenderness: There is no abdominal tenderness. There is no guarding.  Genitourinary:    Pubic Area: No rash.      Labia:        Right: No rash or tenderness.        Left: No rash or tenderness.      Vagina: Normal.     Cervix: Normal.     Uterus: Normal.      Adnexa: Right adnexa normal and left adnexa normal.     Rectum: Normal. Guaiac result negative. No mass.  Musculoskeletal:        General: Normal range of motion.     Cervical back: Normal range of motion and neck supple.  Lymphadenopathy:     Cervical: No cervical adenopathy.     Upper Body:     Right upper body: No supraclavicular adenopathy.     Left upper body: No supraclavicular adenopathy.     Lower Body: No left inguinal  adenopathy.  Skin:    General: Skin is warm and dry.  Neurological:     Mental Status: She is alert.     Deep Tendon Reflexes: Reflexes are normal and symmetric.     Wt Readings from Last 3 Encounters:  10/23/23 158 lb 6.4 oz (71.8 kg)  11/25/22 161 lb (73 kg)  10/31/22 166 lb (75.3 kg)    BP 124/76   Pulse 82   Ht 5\' 3"  (1.6 m)   Wt 158 lb 6.4 oz (71.8 kg)   SpO2 98%   BMI 28.06 kg/m   Assessment and Plan:  1. Annual physical exam No subjective/objective concerns noted during HPI, review of past medical history/review of most recent labs/review of medications, review of systems and physical exam.  Will check CMP and lipid for current status. - Comprehensive metabolic panel - Lipid Panel With LDL/HDL Ratio  2. Dyslipidemia Patient with history of dyslipidemia we will refill atorvastatin 10 mg daily. - atorvastatin (LIPITOR) 10 MG tablet; Take 1 tablet (10 mg total) by mouth daily.  Dispense: 90 tablet; Refill: 1  3. Gastroesophageal reflux disease Patient with history of gastric esophageal reflux disease we will treat with omeprazole 40 mg daily. - omeprazole (PRILOSEC) 40 MG capsule; TAKE (1) CAPSULE BY MOUTH EVERY DAY  Dispense: 90 capsule; Refill: 1  4. Recurrent major depressive disorder, in full remission (HCC) Chronic.  Controlled.  Stable.  PHQ is 3 and GAD score is 5.  Continue sertraline 50 mg  daily. - sertraline (ZOLOFT) 50 MG tablet; Take 1 tablet (50 mg total) by mouth daily.  Dispense: 90 tablet; Refill: 1  5. Encounter for screening mammogram for malignant neoplasm of breast (Primary) Breast exam was done and patient is going to be screened with 3D bilateral breast mammography. - MM 3D SCREENING MAMMOGRAM BILATERAL BREAST  6. Screen for colon cancer Discussed and referral placed to gastroenterology. - Ambulatory referral to Gastroenterology  7. Screening for cervical cancer Discussed and Pap and pelvic was done on patient. - Cytology - PAP     Elizabeth Sauer, MD

## 2023-10-24 LAB — COMPREHENSIVE METABOLIC PANEL
ALT: 109 [IU]/L — ABNORMAL HIGH (ref 0–32)
AST: 181 [IU]/L — ABNORMAL HIGH (ref 0–40)
Albumin: 3.9 g/dL (ref 3.9–4.9)
Alkaline Phosphatase: 163 [IU]/L — ABNORMAL HIGH (ref 44–121)
BUN/Creatinine Ratio: 10 — ABNORMAL LOW (ref 12–28)
BUN: 8 mg/dL (ref 8–27)
Bilirubin Total: 0.3 mg/dL (ref 0.0–1.2)
CO2: 25 mmol/L (ref 20–29)
Calcium: 9.2 mg/dL (ref 8.7–10.3)
Chloride: 104 mmol/L (ref 96–106)
Creatinine, Ser: 0.82 mg/dL (ref 0.57–1.00)
Globulin, Total: 2.4 g/dL (ref 1.5–4.5)
Glucose: 87 mg/dL (ref 70–99)
Potassium: 5 mmol/L (ref 3.5–5.2)
Sodium: 144 mmol/L (ref 134–144)
Total Protein: 6.3 g/dL (ref 6.0–8.5)
eGFR: 80 mL/min/{1.73_m2} (ref >59)

## 2023-10-24 LAB — LIPID PANEL WITH LDL/HDL RATIO
Cholesterol, Total: 157 mg/dL (ref 100–199)
HDL: 97 mg/dL (ref >39)
LDL Chol Calc (NIH): 48 mg/dL (ref 0–99)
LDL/HDL Ratio: 0.5 {ratio} (ref 0.0–3.2)
Triglycerides: 55 mg/dL (ref 0–149)
VLDL Cholesterol Cal: 12 mg/dL (ref 5–40)

## 2023-10-25 LAB — CYTOLOGY - PAP
Adequacy: ABSENT
Comment: NEGATIVE
Diagnosis: NEGATIVE
High risk HPV: NEGATIVE

## 2023-10-26 ENCOUNTER — Encounter: Payer: Self-pay | Admitting: Family Medicine

## 2023-10-27 ENCOUNTER — Encounter: Payer: Self-pay | Admitting: Family Medicine

## 2023-10-27 ENCOUNTER — Ambulatory Visit (INDEPENDENT_AMBULATORY_CARE_PROVIDER_SITE_OTHER): Payer: 59 | Admitting: Family Medicine

## 2023-10-27 VITALS — BP 102/72 | HR 87 | Ht 63.0 in | Wt 158.0 lb

## 2023-10-27 DIAGNOSIS — R10811 Right upper quadrant abdominal tenderness: Secondary | ICD-10-CM

## 2023-10-27 DIAGNOSIS — R7401 Elevation of levels of liver transaminase levels: Secondary | ICD-10-CM

## 2023-10-27 NOTE — Progress Notes (Signed)
 Date:  10/27/2023   Name:  Melissa Huerta   DOB:  Dec 06, 1960   MRN:  [id number]   Chief Complaint: Labs Only  Patient is a 63 year old female who presents for a hepatic  exam. The patient reports the following problems: elevated transaminases. Health maintenance has been reviewed up to date.      Lab Results  Component Value Date   NA 144 10/23/2023   K 5.0 10/23/2023   CO2 25 10/23/2023   GLUCOSE 87 10/23/2023   BUN 8 10/23/2023   CREATININE 0.82 10/23/2023   CALCIUM  9.2 10/23/2023   EGFR 80 10/23/2023   GFRNONAA 83 12/05/2019   Lab Results  Component Value Date   CHOL 157 10/23/2023   HDL 97 10/23/2023   LDLCALC 48 10/23/2023   TRIG 55 10/23/2023   CHOLHDL 3.8 05/25/2018   Lab Results  Component Value Date   TSH 1.440 04/21/2021   No results found for: HGBA1C Lab Results  Component Value Date   WBC 4.0 11/25/2022   HGB 13.5 11/25/2022   HCT 39.2 11/25/2022   MCV 93 11/25/2022   PLT 290 11/25/2022   Lab Results  Component Value Date   ALT 109 (H) 10/23/2023   AST 181 (H) 10/23/2023   ALKPHOS 163 (H) 10/23/2023   BILITOT 0.3 10/23/2023   No results found for: 25OHVITD2, 25OHVITD3, VD25OH   Review of Systems  Constitutional:  Negative for fatigue and unexpected weight change.  Respiratory:  Negative for chest tightness, shortness of breath and wheezing.   Cardiovascular:  Negative for chest pain.  Gastrointestinal:  Negative for abdominal distention, abdominal pain, blood in stool, constipation, diarrhea, nausea, rectal pain and vomiting.    Patient Active Problem List   Diagnosis Date Noted   Primary insomnia 02/15/2019   Seasonal allergic rhinitis due to pollen 02/15/2019   Encounter for screening colonoscopy    Benign neoplasm of ascending colon    Benign neoplasm of descending colon    Recurrent major depressive disorder, in full remission (HCC) 11/08/2017   Gastroesophageal reflux disease 11/08/2017   Influenza vaccine needed  11/14/2016    Allergies  Allergen Reactions   Banana Nausea And Vomiting   Codeine  Other (See Comments)    STOMACH ISSUES   Penicillins Other (See Comments)    LOCAL REACTION, SWELLING   Percocet [Oxycodone-Acetaminophen] Nausea Only and Other (See Comments)    DARVOCET,DARVON, AND OTHER PAIN MEDICINES    Past Surgical History:  Procedure Laterality Date   COLONOSCOPY  2007    [facility]   COLONOSCOPY WITH PROPOFOL  N/A 07/05/2018   Procedure: COLONOSCOPY WITH BIOPSIES;  Surgeon: Jinny Carmine, MD;  Location: [facility];  Service: Endoscopy;  Laterality: N/A;  request early   GALLBLADDER SURGERY     GASTRIC BYPASS     NASAL SINUS SURGERY  2014   POLYPECTOMY N/A 07/05/2018   Procedure: POLYPECTOMY;  Surgeon: Jinny Carmine, MD;  Location: [facility];  Service: Endoscopy;  Laterality: N/A;    Social History   Tobacco Use   Smoking status: Some Days    Current packs/day: 0.00    Average packs/day: 0.3 packs/day for 30.0 years (7.5 ttl pk-yrs)    Types: Cigarettes    Start date: 07/08/1990    Last attempt to quit: 07/08/2020    Years since quitting: 3.3   Smokeless tobacco: Never   Tobacco comments:    smokes socially,   Vaping Use   Vaping status: Never Used  Substance Use Topics   Alcohol use: Yes    Alcohol/week: 6.0 standard drinks of alcohol    Types: 4 Cans of beer, 2 Shots of liquor per week   Drug use: No     Medication list has been reviewed and updated.  Current Meds  Medication Sig   atorvastatin  (LIPITOR) 10 MG tablet Take 1 tablet (10 mg total) by mouth daily.   azelastine  (ASTELIN ) 0.1 % nasal spray Place 2 sprays into both nostrils 2 (two) times daily. Use in each nostril as directed   fluticasone  (FLONASE ) 50 MCG/ACT nasal spray 1 SPRAY IN EACH NOSTRIL EVERY DAY   Multiple Vitamins-Minerals (CENTRUM SILVER 50+WOMEN PO) Take by mouth daily. AM   omeprazole  (PRILOSEC) 40 MG capsule TAKE (1) CAPSULE BY MOUTH EVERY DAY   sertraline   (ZOLOFT ) 50 MG tablet Take 1 tablet (50 mg total) by mouth daily.       10/23/2023    9:15 AM 11/25/2022    8:31 AM 10/31/2022    4:33 PM 04/21/2021    9:50 AM  GAD 7 : Generalized Anxiety Score  Nervous, Anxious, on Edge 0 1 1 1   Control/stop worrying 1 0 1 1  Worry too much - different things 1 0 0 1  Trouble relaxing 1 0 1 1  Restless 0 0 0 0  Easily annoyed or irritable 1 2 1 2   Afraid - awful might happen 1 1 0 0  Total GAD 7 Score 5 4 4 6   Anxiety Difficulty Not difficult at all Somewhat difficult Somewhat difficult Not difficult at all       10/23/2023    9:14 AM 11/25/2022    8:28 AM 10/31/2022    4:32 PM  Depression screen PHQ 2/9  Decreased Interest 0 0 0  Down, Depressed, Hopeless 0 0 0  PHQ - 2 Score 0 0 0  Altered sleeping 1 1 0  Tired, decreased energy 1 2 0  Change in appetite 0 0 0  Feeling bad or failure about yourself  0 0 0  Trouble concentrating 1 0 0  Moving slowly or fidgety/restless 0 0 0  Suicidal thoughts 0 0 0  PHQ-9 Score 3 3 0  Difficult doing work/chores Not difficult at all Not difficult at all Not difficult at all    BP Readings from Last 3 Encounters:  10/27/23 102/72  10/23/23 124/76  11/25/22 122/78    Physical Exam Vitals and nursing note reviewed.  HENT:     Right Ear: Tympanic membrane normal.     Left Ear: Tympanic membrane normal.  Skin:    Coloration: Skin is pale. Skin is not ashen, cyanotic or jaundiced.  Neurological:     Mental Status: She is alert.     Wt Readings from Last 3 Encounters:  10/27/23 158 lb (71.7 kg)  10/23/23 158 lb 6.4 oz (71.8 kg)  11/25/22 161 lb (73 kg)    BP 102/72   Pulse 87   Ht 5' 3 (1.6 m)   Wt 158 lb (71.7 kg)   SpO2 96%   BMI 27.99 kg/m   Assessment and Plan:  1. Elevated transaminase level (Primary) Patient was noted on regular exam to have elevated transaminases that are significantly elevated.  On questioning patient does have daily alcohol intake in particular he had a increased  intake of alcohol shortly before labs that were drawn the day after.  Although patient is not having any abdominal discomfort there is discomfort with palpation and  we will check amylase and lipase to evaluate pancreas as well as obtain an ultrasound of the right upper quadrant to asses liver and surrounding area. - Amylase - Lipase - US  Abdomen Limited RUQ (LIVER/GB); Future  2. Right upper quadrant abdominal tenderness without rebound tenderness Patient has noted to have an tenderness without palpable liver edge.  This could be consistent either with hepatic inflammation secondary to alcohol intake or pancreatitis and we will proceed with further evaluation.  We will also obtain a CBC at which we will likely calculate a fib-4 - CBC with Differential/Platelet - Amylase - Lipase - US  Abdomen Limited RUQ (LIVER/GB); Future    Cathryne Molt, MD

## 2023-10-28 ENCOUNTER — Encounter: Payer: Self-pay | Admitting: Family Medicine

## 2023-10-28 LAB — CBC WITH DIFFERENTIAL/PLATELET
Basophils Absolute: 0 10*3/uL (ref 0.0–0.2)
Basos: 1 %
EOS (ABSOLUTE): 0.2 10*3/uL (ref 0.0–0.4)
Eos: 4 %
Hematocrit: 40.7 % (ref 34.0–46.6)
Hemoglobin: 13.8 g/dL (ref 11.1–15.9)
Immature Grans (Abs): 0 10*3/uL (ref 0.0–0.1)
Immature Granulocytes: 0 %
Lymphocytes Absolute: 1.3 10*3/uL (ref 0.7–3.1)
Lymphs: 28 %
MCH: 33.7 pg — ABNORMAL HIGH (ref 26.6–33.0)
MCHC: 33.9 g/dL (ref 31.5–35.7)
MCV: 100 fL — ABNORMAL HIGH (ref 79–97)
Monocytes Absolute: 0.5 10*3/uL (ref 0.1–0.9)
Monocytes: 9 %
Neutrophils Absolute: 2.8 10*3/uL (ref 1.4–7.0)
Neutrophils: 58 %
Platelets: 237 10*3/uL (ref 150–450)
RBC: 4.09 x10E6/uL (ref 3.77–5.28)
RDW: 12.3 % (ref 11.7–15.4)
WBC: 4.8 10*3/uL (ref 3.4–10.8)

## 2023-10-28 LAB — AMYLASE: Amylase: 26 U/L — ABNORMAL LOW (ref 31–110)

## 2023-10-28 LAB — LIPASE: Lipase: 20 U/L (ref 14–72)

## 2023-10-29 ENCOUNTER — Other Ambulatory Visit: Payer: Self-pay | Admitting: Family Medicine

## 2023-10-29 DIAGNOSIS — R7401 Elevation of levels of liver transaminase levels: Secondary | ICD-10-CM

## 2023-10-30 NOTE — Telephone Encounter (Signed)
 Done

## 2023-11-01 ENCOUNTER — Ambulatory Visit
Admission: RE | Admit: 2023-11-01 | Discharge: 2023-11-01 | Disposition: A | Discharge status: Home / Self Care | Payer: 59 | Source: Ambulatory Visit | Attending: Family Medicine | Admitting: Family Medicine

## 2023-11-01 DIAGNOSIS — R7401 Elevation of levels of liver transaminase levels: Secondary | ICD-10-CM | POA: Insufficient documentation

## 2023-11-01 DIAGNOSIS — R10811 Right upper quadrant abdominal tenderness: Secondary | ICD-10-CM | POA: Diagnosis present

## 2023-11-02 ENCOUNTER — Encounter: Payer: Self-pay | Admitting: Family Medicine

## 2023-11-02 ENCOUNTER — Encounter: Payer: Self-pay | Admitting: *Deleted

## 2023-11-20 ENCOUNTER — Ambulatory Visit
Admission: RE | Admit: 2023-11-20 | Discharge: 2023-11-20 | Disposition: A | Discharge status: Home / Self Care | Payer: 59 | Source: Ambulatory Visit | Attending: Family Medicine | Admitting: Family Medicine

## 2023-11-20 DIAGNOSIS — Z1231 Encounter for screening mammogram for malignant neoplasm of breast: Secondary | ICD-10-CM | POA: Insufficient documentation

## 2024-01-15 ENCOUNTER — Other Ambulatory Visit: Payer: Self-pay | Admitting: Family Medicine

## 2024-01-15 DIAGNOSIS — K219 Gastro-esophageal reflux disease without esophagitis: Secondary | ICD-10-CM

## 2024-01-15 DIAGNOSIS — F3342 Major depressive disorder, recurrent, in full remission: Secondary | ICD-10-CM

## 2024-01-18 NOTE — Telephone Encounter (Signed)
 Too soon for RF. Last RF 10/23/23 for 90 and 1 refill.  Requested Prescriptions  Pending Prescriptions Disp Refills   omeprazole  (PRILOSEC) 40 MG capsule [Pharmacy Med Name: OMEPRAZOLE  40MG  CAPSULE DR] 90 capsule 1    Sig: TAKE (1) CAPSULE BY MOUTH EVERY DAY     Gastroenterology: Proton Pump Inhibitors Passed - 01/18/2024  9:09 AM      Passed - Valid encounter within last 12 months    Recent Outpatient Visits           2 months ago Elevated transaminase level   [facility], [facility]   2 months ago Encounter for screening mammogram for malignant neoplasm of breast   [place] [facility], MD               sertraline  (ZOLOFT ) 50 MG tablet [Pharmacy Med Name: SERTRALINE  HYDROCHLORIDE 50MG  TABLET] 90 tablet 1    Sig: TAKE (1) TABLET BY MOUTH EVERY DAY     Psychiatry:  Antidepressants - SSRI - sertraline  Failed - 01/18/2024  9:09 AM      Failed - AST in normal range and within 360 days    AST  Date Value Ref Range Status  10/23/2023 181 (H) 0 - 40 IU/L Final         Failed - ALT in normal range and within 360 days    ALT  Date Value Ref Range Status  10/23/2023 109 (H) 0 - 32 IU/L Final         Passed - Completed PHQ-2 or PHQ-9 in the last 360 days      Passed - Valid encounter within last 6 months    Recent Outpatient Visits           2 months ago Elevated transaminase level   [facility], MD   2 months ago Encounter for screening mammogram for malignant neoplasm of breast    [facility], Deanna C, MD

## 2024-05-09 ENCOUNTER — Encounter: Payer: Self-pay | Admitting: Family Medicine

## 2024-05-09 ENCOUNTER — Ambulatory Visit: Admitting: Family Medicine

## 2024-05-09 VITALS — BP 114/72 | HR 106 | Ht 63.0 in | Wt 152.2 lb

## 2024-05-09 DIAGNOSIS — J0110 Acute frontal sinusitis, unspecified: Secondary | ICD-10-CM

## 2024-05-09 MED ORDER — AZITHROMYCIN 250 MG PO TABS
ORAL_TABLET | ORAL | 0 refills | Status: DC
Start: 2024-05-09 — End: 2024-08-08

## 2024-05-09 MED ORDER — IPRATROPIUM BROMIDE 0.03 % NA SOLN: 2.0000 | Freq: Two times a day (BID) | NASAL | 0 refills | Status: DC

## 2024-05-09 NOTE — Progress Notes (Signed)
   Acute Office Visit  Subjective:     Patient ID: Melissa Huerta, female    DOB: Mar 26, 1961, 63 y.o.   MRN: [id number]  Chief Complaint  Patient presents with   Headache   Cough    Patient has developed a cough x 1 week, congested, no fevers, feeling fatigue     C/o Upper Respiratory Infection Patient complains of symptoms of a URI, possible sinusitis. Symptoms include congestion, cough described as productive, headache described as dull, nasal congestion, and no  fever. Onset of symptoms was 1 week ago, and has been gradually worsening since that time. Treatment to date: cough suppressants. Mucinex , afrin   Reports she suffers from allergies and recurrent sinus infection, her previous PCP dose 2 rounds of Zpak.     Patient is in today for   Review of Systems  All other systems reviewed and are negative.       Objective:    BP 114/72   Pulse (!) 106   Ht 5' 3 (1.6 m)   Wt 152 lb 4 oz (69.1 kg)   SpO2 97%   BMI 26.97 kg/m    Physical Exam Vitals and nursing note reviewed.  Constitutional:      Appearance: Normal appearance.  HENT:     Head: Normocephalic.     Right Ear: External ear normal.     Left Ear: External ear normal.  Eyes:     Conjunctiva/sclera: Conjunctivae normal.  Cardiovascular:     Rate and Rhythm: Normal rate.  Pulmonary:     Effort: Pulmonary effort is normal. No respiratory distress.  Abdominal:     Palpations: Abdomen is soft.  Musculoskeletal:        General: Normal range of motion.  Skin:    General: Skin is warm.  Neurological:     Mental Status: She is alert and oriented to person, place, and time.  Psychiatric:        Mood and Affect: Mood normal.     No results found for any visits on 05/09/24.      Assessment & Plan:   Problem List Items Addressed This Visit   None Visit Diagnoses       Subacute frontal sinusitis    -  Primary   Relevant Medications   azithromycin  (ZITHROMAX  Z-PAK) 250 MG tablet   ipratropium  (ATROVENT ) 0.03 % nasal spray       Meds ordered this encounter  Medications   azithromycin  (ZITHROMAX  Z-PAK) 250 MG tablet    Sig: Take 2 tablets (500 mg) PO today, then 1 tablet (250 mg) PO daily x4 days.    Dispense:  6 tablet    Refill:  0   ipratropium (ATROVENT ) 0.03 % nasal spray    Sig: Place 2 sprays into both nostrils every 12 (twelve) hours for 3 days.    Dispense:  30 mL    Refill:  0  Discussed about stopping Afrin, starting Atrovent , Vitamin C, ZINC,  continuing Mucinex  and Tylenol. If no improvement start Zpak.  No follow-ups on file.  Melissa K Letonia Stead, MD

## 2024-07-22 ENCOUNTER — Encounter

## 2024-08-08 ENCOUNTER — Encounter: Payer: Self-pay | Admitting: Physician Assistant

## 2024-08-08 ENCOUNTER — Ambulatory Visit: Admitting: Physician Assistant

## 2024-08-08 VITALS — BP 102/74 | HR 91 | Temp 97.8°F | Ht 63.0 in | Wt 154.0 lb

## 2024-08-08 DIAGNOSIS — Z23 Encounter for immunization: Secondary | ICD-10-CM

## 2024-08-08 DIAGNOSIS — J301 Allergic rhinitis due to pollen: Secondary | ICD-10-CM | POA: Diagnosis not present

## 2024-08-08 DIAGNOSIS — Z79899 Other long term (current) drug therapy: Secondary | ICD-10-CM | POA: Insufficient documentation

## 2024-08-08 DIAGNOSIS — E785 Hyperlipidemia, unspecified: Secondary | ICD-10-CM | POA: Diagnosis not present

## 2024-08-08 DIAGNOSIS — R7401 Elevation of levels of liver transaminase levels: Secondary | ICD-10-CM | POA: Insufficient documentation

## 2024-08-08 DIAGNOSIS — F3342 Major depressive disorder, recurrent, in full remission: Secondary | ICD-10-CM | POA: Diagnosis not present

## 2024-08-08 DIAGNOSIS — K219 Gastro-esophageal reflux disease without esophagitis: Secondary | ICD-10-CM

## 2024-08-08 MED ORDER — TRIAMCINOLONE ACETONIDE 55 MCG/ACT NA AERO: 2.0000 | INHALATION_SPRAY | Freq: Every day | NASAL | 12 refills | Status: AC

## 2024-08-08 MED ORDER — ATORVASTATIN CALCIUM 10 MG PO TABS: 10.0000 mg | ORAL_TABLET | Freq: Every day | ORAL | 1 refills | Status: AC

## 2024-08-08 MED ORDER — SERTRALINE HCL 50 MG PO TABS: 50.0000 mg | ORAL_TABLET | Freq: Every day | ORAL | 1 refills | Status: AC

## 2024-08-08 MED ORDER — OMEPRAZOLE 40 MG PO CPDR: DELAYED_RELEASE_CAPSULE | ORAL | 1 refills | Status: AC

## 2024-08-08 NOTE — Assessment & Plan Note (Signed)
 Try switching from fluticasone  to triamcinolone intranasal.  Also briefly discussed trying montelukast as a potential option if intranasal corticosteroid is not effective.

## 2024-08-08 NOTE — Progress Notes (Signed)
 Date:  08/08/2024   Name:  Melissa Huerta   DOB:  September 21, 1960   MRN:  [id number]   Chief Complaint: Transitions Of Care  HPI  Melissa Huerta is a very pleasant 63 year old female with a history of HLD, MDD, GERD, allergic rhinitis who presents new to me today as a transfer of care from a recently retired animator Dr. Cathryne Molt.  She is requesting refills on some of her medications, but otherwise has no particular complaints.  She is struggling with alcohol use in the past, resulting in elevated liver enzymes, but says she has done better with this.  At present, she reports that she drinks about twice per week, 2-3 beers each time on average.  She is due to repeat some labs.  Medication list has been reviewed and updated.  Current Meds  Medication Sig   atorvastatin  (LIPITOR) 10 MG tablet Take 1 tablet (10 mg total) by mouth daily.   CYANOCOBALAMIN PO Take by mouth.   Multiple Vitamins-Minerals (CENTRUM SILVER 50+WOMEN PO) Take by mouth daily. AM   omeprazole  (PRILOSEC) 40 MG capsule TAKE (1) CAPSULE BY MOUTH EVERY DAY   sertraline  (ZOLOFT ) 50 MG tablet Take 1 tablet (50 mg total) by mouth daily.   triamcinolone (NASACORT) 55 MCG/ACT AERO nasal inhaler Place 2 sprays into the nose daily.   [DISCONTINUED] atorvastatin  (LIPITOR) 10 MG tablet Take 1 tablet (10 mg total) by mouth daily.   [DISCONTINUED] fluticasone  (FLONASE ) 50 MCG/ACT nasal spray 1 SPRAY IN EACH NOSTRIL EVERY DAY   [DISCONTINUED] omeprazole  (PRILOSEC) 40 MG capsule TAKE (1) CAPSULE BY MOUTH EVERY DAY   [DISCONTINUED] sertraline  (ZOLOFT ) 50 MG tablet Take 1 tablet (50 mg total) by mouth daily.     Review of Systems  Patient Active Problem List   Diagnosis Date Noted   Dyslipidemia 08/08/2024   Elevated transaminase level 08/08/2024   Long-term current use of proton pump inhibitor therapy 08/08/2024   Primary insomnia 02/15/2019   Seasonal allergic rhinitis due to pollen 02/15/2019   Encounter for screening  colonoscopy    Benign neoplasm of ascending colon    Benign neoplasm of descending colon    Recurrent major depressive disorder, in full remission 11/08/2017   Gastroesophageal reflux disease 11/08/2017   Influenza vaccine needed 11/14/2016    Allergies  Allergen Reactions   Banana Nausea And Vomiting   Codeine  Other (See Comments)    STOMACH ISSUES   Penicillins Other (See Comments)    LOCAL REACTION, SWELLING   Percocet [Oxycodone-Acetaminophen] Nausea Only and Other (See Comments)    DARVOCET,DARVON, AND OTHER PAIN MEDICINES    Immunization History  Administered Date(s) Administered   Influenza, Seasonal, Injecte, Preservative Fre 08/08/2024   Influenza,inj,Quad PF,6+ Mos 11/14/2016, 05/25/2018, 06/10/2020   PFIZER(Purple Top)SARS-COV-2 Vaccination 12/12/2019, 01/06/2020   PNEUMOCOCCAL CONJUGATE-20 08/08/2024   Tdap 06/20/2011, 08/08/2024   Zoster Recombinant(Shingrix) 06/06/2018, 08/10/2018    Past Surgical History:  Procedure Laterality Date   COLONOSCOPY  2007    [facility]   COLONOSCOPY WITH PROPOFOL  N/A 07/05/2018   Procedure: COLONOSCOPY WITH BIOPSIES;  Surgeon: Jinny Carmine, MD;  Location: [facility];  Service: Endoscopy;  Laterality: N/A;  request early   GALLBLADDER SURGERY     GASTRIC BYPASS     NASAL SINUS SURGERY  2014   POLYPECTOMY N/A 07/05/2018   Procedure: POLYPECTOMY;  Surgeon: Jinny Carmine, MD;  Location: [facility];  Service: Endoscopy;  Laterality: N/A;    Social History   Tobacco Use  Smoking status: Former    Current packs/day: 0.00    Average packs/day: 0.3 packs/day for 30.0 years (7.5 ttl pk-yrs)    Types: Cigarettes    Start date: 07/08/1990    Quit date: 07/08/2020    Years since quitting: 4.0   Smokeless tobacco: Never   Tobacco comments:    smokes socially,   Vaping Use   Vaping status: Never Used  Substance Use Topics   Alcohol use: Not Currently    Alcohol/week: 6.0 standard drinks of alcohol    Types: 6  Cans of beer per week    Comment: drinks 1-2 times per week, 2-3 beers each time   Drug use: No    Family History  Problem Relation Age of Onset   Hypertension Mother    Hypertension Sister    Breast cancer Neg Hx         08/08/2024    1:21 PM 05/09/2024    1:19 PM 10/23/2023    9:15 AM 11/25/2022    8:31 AM  GAD 7 : Generalized Anxiety Score  Nervous, Anxious, on Edge 1 0 0 1  Control/stop worrying 1 0 1 0  Worry too much - different things 0 1 1 0  Trouble relaxing 0 1 1 0  Restless 0 0 0 0  Easily annoyed or irritable 0 0 1 2  Afraid - awful might happen 0 0 1 1  Total GAD 7 Score 2 2 5 4   Anxiety Difficulty Not difficult at all Not difficult at all Not difficult at all Somewhat difficult       08/08/2024    1:21 PM 05/09/2024    1:19 PM 10/23/2023    9:14 AM  Depression screen PHQ 2/9  Decreased Interest 0 0 0  Down, Depressed, Hopeless 0 0 0  PHQ - 2 Score 0 0 0  Altered sleeping 0 1 1  Tired, decreased energy 0 2 1  Change in appetite 0 0 0  Feeling bad or failure about yourself  0 0 0  Trouble concentrating 0 0 1  Moving slowly or fidgety/restless 0 0 0  Suicidal thoughts 0 0 0  PHQ-9 Score 0 3  3   Difficult doing work/chores Not difficult at all Not difficult at all Not difficult at all     Data saved with a previous flowsheet row definition    BP Readings from Last 3 Encounters:  08/08/24 102/74  05/09/24 114/72  10/27/23 102/72    Wt Readings from Last 3 Encounters:  08/08/24 154 lb (69.9 kg)  05/09/24 152 lb 4 oz (69.1 kg)  10/27/23 158 lb (71.7 kg)    BP 102/74   Pulse 91   Temp 97.8 F (36.6 C)   Ht 5' 3 (1.6 m)   Wt 154 lb (69.9 kg)   SpO2 97%   BMI 27.28 kg/m   Physical Exam Vitals and nursing note reviewed.  Constitutional:      Appearance: Normal appearance.  Cardiovascular:     Rate and Rhythm: Normal rate and regular rhythm.     Heart sounds: No murmur heard.    No friction rub. No gallop.  Pulmonary:     Effort:  Pulmonary effort is normal.     Breath sounds: Normal breath sounds.  Abdominal:     General: There is no distension.  Musculoskeletal:        General: Normal range of motion.  Skin:    General: Skin is warm and dry.  Neurological:     Mental Status: She is alert and oriented to person, place, and time.     Gait: Gait is intact.  Psychiatric:        Mood and Affect: Mood and affect normal.     Recent Labs     Component Value Date/Time   NA 144 10/23/2023 1057   K 5.0 10/23/2023 1057   CL 104 10/23/2023 1057   CO2 25 10/23/2023 1057   GLUCOSE 87 10/23/2023 1057   BUN 8 10/23/2023 1057   CREATININE 0.82 10/23/2023 1057   CALCIUM  9.2 10/23/2023 1057   PROT 6.3 10/23/2023 1057   ALBUMIN 3.9 10/23/2023 1057   AST 181 (H) 10/23/2023 1057   ALT 109 (H) 10/23/2023 1057   ALKPHOS 163 (H) 10/23/2023 1057   BILITOT 0.3 10/23/2023 1057   GFRNONAA 83 12/05/2019 1126   GFRAA 96 12/05/2019 1126    Lab Results  Component Value Date   WBC 4.8 10/27/2023   HGB 13.8 10/27/2023   HCT 40.7 10/27/2023   MCV 100 (H) 10/27/2023   PLT 237 10/27/2023   No results found for: HGBA1C Lab Results  Component Value Date   CHOL 157 10/23/2023   HDL 97 10/23/2023   LDLCALC 48 10/23/2023   TRIG 55 10/23/2023   CHOLHDL 3.8 05/25/2018   Lab Results  Component Value Date   TSH 1.440 04/21/2021      Assessment and Plan:  Dyslipidemia Assessment & Plan: Plan for patient to return to [facility] for fasting labs whenever convenient for her.   Orders: -     Atorvastatin  Calcium ; Take 1 tablet (10 mg total) by mouth daily.  Dispense: 90 tablet; Refill: 1 -     Lipid panel  Gastroesophageal reflux disease Assessment & Plan: She mentions a hiatal hernia was previously discovered and thought to be the cause of her GERD; I cannot find any record of this in the chart, but she says it was never repaired.    Discussed potential risks of long-term PPI including decreased absorption of nutrients  and increased risk of low bone density.  For now, we will not adjust her dose, but consider assisted taper with famotidine at a later date.  Orders: -     Omeprazole ; TAKE (1) CAPSULE BY MOUTH EVERY DAY  Dispense: 90 capsule; Refill: 1  Seasonal allergic rhinitis due to pollen Assessment & Plan: Try switching from fluticasone  to triamcinolone intranasal.  Also briefly discussed trying montelukast as a potential option if intranasal corticosteroid is not effective.  Orders: -     Triamcinolone Acetonide; Place 2 sprays into the nose daily.  Dispense: 1 each; Refill: 12  Recurrent major depressive disorder, in full remission Assessment & Plan: Refill sertraline  at the current dose  Orders: -     Sertraline  HCl; Take 1 tablet (50 mg total) by mouth daily.  Dispense: 90 tablet; Refill: 1  Elevated transaminase level Assessment & Plan: Repeat LFTs  Orders: -     Hepatic function panel  Long-term current use of proton pump inhibitor therapy Assessment & Plan: Discussed potential risks of long-term PPI including decreased absorption of nutrients and increased risk of low bone density.  For now, we will not adjust her dose, but consider assisted taper with famotidine at a later date.   Encounter for immunization -     Pneumococcal conjugate vaccine 20-valent -     Flu vaccine trivalent PF, 6mos and older(Flulaval,Afluria,Fluarix,Fluzone) -     Tdap vaccine greater than  or equal to 7yo IM     Return in about 6 months (around 02/05/2025) for fasting CPE.    Rolan Hoyle, PA-C, DMSc, Nutritionist [facility] [phone]

## 2024-08-08 NOTE — Assessment & Plan Note (Signed)
 She mentions a hiatal hernia was previously discovered and thought to be the cause of her GERD; I cannot find any record of this in the chart, but she says it was never repaired.    Discussed potential risks of long-term PPI including decreased absorption of nutrients and increased risk of low bone density.  For now, we will not adjust her dose, but consider assisted taper with famotidine at a later date.

## 2024-08-08 NOTE — Assessment & Plan Note (Signed)
 Plan for patient to return to [facility] for fasting labs whenever convenient for her.

## 2024-08-08 NOTE — Assessment & Plan Note (Signed)
 Discussed potential risks of long-term PPI including decreased absorption of nutrients and increased risk of low bone density.  For now, we will not adjust her dose, but consider assisted taper with famotidine at a later date.

## 2024-08-08 NOTE — Assessment & Plan Note (Signed)
 Refill sertraline  at the current dose

## 2024-08-08 NOTE — Assessment & Plan Note (Signed)
 Repeat LFTs

## 2024-09-17 ENCOUNTER — Ambulatory Visit: Payer: Self-pay | Admitting: Physician Assistant

## 2024-09-17 LAB — HEPATIC FUNCTION PANEL
ALT: 9 IU/L (ref 0–32)
AST: 13 IU/L (ref 0–40)
Albumin: 3.7 g/dL — ABNORMAL LOW (ref 3.9–4.9)
Alkaline Phosphatase: 107 IU/L (ref 49–135)
Bilirubin Total: <0.2 mg/dL (ref 0.0–1.2)
Bilirubin, Direct: <0.08 mg/dL (ref 0.00–0.40)
Total Protein: 6 g/dL (ref 6.0–8.5)

## 2024-09-17 LAB — LIPID PANEL
Chol/HDL Ratio: 2.3 ratio (ref 0.0–4.4)
Cholesterol, Total: 180 mg/dL (ref 100–199)
HDL: 80 mg/dL
LDL Chol Calc (NIH): 84 mg/dL (ref 0–99)
Triglycerides: 90 mg/dL (ref 0–149)
VLDL Cholesterol Cal: 16 mg/dL (ref 5–40)
